# Patient Record
Sex: Male | Born: 1945
Health system: Southern US, Community
[De-identification: ages and names within clinical notes are randomized; demographics above are authoritative.]

## PROBLEM LIST (undated history)

## (undated) DIAGNOSIS — E119 Type 2 diabetes mellitus without complications: Secondary | ICD-10-CM

## (undated) DIAGNOSIS — K219 Gastro-esophageal reflux disease without esophagitis: Secondary | ICD-10-CM

## (undated) DIAGNOSIS — G009 Bacterial meningitis, unspecified: Secondary | ICD-10-CM

## (undated) DIAGNOSIS — I10 Essential (primary) hypertension: Secondary | ICD-10-CM

## (undated) DIAGNOSIS — E785 Hyperlipidemia, unspecified: Secondary | ICD-10-CM

## (undated) DIAGNOSIS — D3610 Benign neoplasm of peripheral nerves and autonomic nervous system, unspecified: Secondary | ICD-10-CM

## (undated) DIAGNOSIS — E11621 Type 2 diabetes mellitus with foot ulcer: Secondary | ICD-10-CM

## (undated) DIAGNOSIS — L97509 Non-pressure chronic ulcer of other part of unspecified foot with unspecified severity: Secondary | ICD-10-CM

## (undated) DIAGNOSIS — M109 Gout, unspecified: Secondary | ICD-10-CM

## (undated) DIAGNOSIS — J309 Allergic rhinitis, unspecified: Secondary | ICD-10-CM

## (undated) DIAGNOSIS — E1140 Type 2 diabetes mellitus with diabetic neuropathy, unspecified: Secondary | ICD-10-CM

## (undated) HISTORY — DX: Allergic rhinitis, unspecified: J30.9

## (undated) HISTORY — DX: Hyperlipidemia, unspecified: E78.5

## (undated) HISTORY — PX: BRAIN SURGERY: SHX531

## (undated) HISTORY — DX: Essential (primary) hypertension: I10

---

## 2004-08-01 ENCOUNTER — Ambulatory Visit: Payer: Self-pay | Admitting: Unknown Physician Specialty

## 2006-04-09 HISTORY — PX: COLON SURGERY: SHX602

## 2006-05-07 ENCOUNTER — Ambulatory Visit: Payer: Self-pay | Admitting: Gastroenterology

## 2006-06-03 ENCOUNTER — Ambulatory Visit: Payer: Self-pay | Admitting: Surgery

## 2006-06-11 ENCOUNTER — Inpatient Hospital Stay: Payer: Self-pay | Admitting: Surgery

## 2006-09-14 ENCOUNTER — Inpatient Hospital Stay: Payer: Self-pay | Admitting: Rheumatology

## 2006-09-14 ENCOUNTER — Other Ambulatory Visit: Payer: Self-pay

## 2007-08-26 ENCOUNTER — Ambulatory Visit: Payer: Self-pay | Admitting: Gastroenterology

## 2009-02-10 ENCOUNTER — Ambulatory Visit: Payer: Self-pay | Admitting: Unknown Physician Specialty

## 2010-03-21 ENCOUNTER — Ambulatory Visit: Payer: Self-pay | Admitting: Gastroenterology

## 2010-03-24 LAB — PATHOLOGY REPORT

## 2012-05-27 ENCOUNTER — Ambulatory Visit: Payer: Self-pay | Admitting: Gastroenterology

## 2012-05-29 LAB — PATHOLOGY REPORT

## 2013-03-17 ENCOUNTER — Ambulatory Visit: Payer: Self-pay | Admitting: General Practice

## 2015-03-23 DIAGNOSIS — N183 Chronic kidney disease, stage 3 unspecified: Secondary | ICD-10-CM | POA: Insufficient documentation

## 2015-04-13 DIAGNOSIS — R05 Cough: Secondary | ICD-10-CM | POA: Diagnosis not present

## 2015-04-13 DIAGNOSIS — R0602 Shortness of breath: Secondary | ICD-10-CM | POA: Diagnosis not present

## 2015-04-13 DIAGNOSIS — L089 Local infection of the skin and subcutaneous tissue, unspecified: Secondary | ICD-10-CM | POA: Diagnosis not present

## 2015-04-13 DIAGNOSIS — R062 Wheezing: Secondary | ICD-10-CM | POA: Diagnosis not present

## 2015-04-21 DIAGNOSIS — R0602 Shortness of breath: Secondary | ICD-10-CM | POA: Diagnosis not present

## 2015-04-21 DIAGNOSIS — L089 Local infection of the skin and subcutaneous tissue, unspecified: Secondary | ICD-10-CM | POA: Diagnosis not present

## 2015-04-21 DIAGNOSIS — L97521 Non-pressure chronic ulcer of other part of left foot limited to breakdown of skin: Secondary | ICD-10-CM | POA: Diagnosis not present

## 2015-04-21 DIAGNOSIS — I10 Essential (primary) hypertension: Secondary | ICD-10-CM | POA: Diagnosis not present

## 2015-04-21 DIAGNOSIS — R05 Cough: Secondary | ICD-10-CM | POA: Diagnosis not present

## 2015-04-21 DIAGNOSIS — L03032 Cellulitis of left toe: Secondary | ICD-10-CM | POA: Diagnosis not present

## 2015-04-28 DIAGNOSIS — L97521 Non-pressure chronic ulcer of other part of left foot limited to breakdown of skin: Secondary | ICD-10-CM | POA: Diagnosis not present

## 2015-05-05 DIAGNOSIS — M2042 Other hammer toe(s) (acquired), left foot: Secondary | ICD-10-CM | POA: Diagnosis not present

## 2015-05-05 DIAGNOSIS — L97521 Non-pressure chronic ulcer of other part of left foot limited to breakdown of skin: Secondary | ICD-10-CM | POA: Diagnosis not present

## 2015-05-19 DIAGNOSIS — L97521 Non-pressure chronic ulcer of other part of left foot limited to breakdown of skin: Secondary | ICD-10-CM | POA: Diagnosis not present

## 2015-06-14 DIAGNOSIS — L97521 Non-pressure chronic ulcer of other part of left foot limited to breakdown of skin: Secondary | ICD-10-CM | POA: Diagnosis not present

## 2015-06-30 DIAGNOSIS — R05 Cough: Secondary | ICD-10-CM | POA: Diagnosis not present

## 2015-06-30 DIAGNOSIS — I10 Essential (primary) hypertension: Secondary | ICD-10-CM | POA: Diagnosis not present

## 2015-07-01 DIAGNOSIS — R05 Cough: Secondary | ICD-10-CM | POA: Diagnosis not present

## 2015-07-12 DIAGNOSIS — L97521 Non-pressure chronic ulcer of other part of left foot limited to breakdown of skin: Secondary | ICD-10-CM | POA: Diagnosis not present

## 2015-07-12 DIAGNOSIS — E1142 Type 2 diabetes mellitus with diabetic polyneuropathy: Secondary | ICD-10-CM | POA: Diagnosis not present

## 2015-07-12 DIAGNOSIS — B351 Tinea unguium: Secondary | ICD-10-CM | POA: Diagnosis not present

## 2015-07-18 DIAGNOSIS — E6609 Other obesity due to excess calories: Secondary | ICD-10-CM | POA: Diagnosis not present

## 2015-07-18 DIAGNOSIS — R05 Cough: Secondary | ICD-10-CM | POA: Diagnosis not present

## 2015-07-18 DIAGNOSIS — J449 Chronic obstructive pulmonary disease, unspecified: Secondary | ICD-10-CM | POA: Diagnosis not present

## 2015-07-18 DIAGNOSIS — J309 Allergic rhinitis, unspecified: Secondary | ICD-10-CM | POA: Diagnosis not present

## 2015-07-19 DIAGNOSIS — Z86010 Personal history of colonic polyps: Secondary | ICD-10-CM | POA: Diagnosis not present

## 2015-07-27 ENCOUNTER — Other Ambulatory Visit: Payer: Self-pay | Admitting: Specialist

## 2015-07-27 DIAGNOSIS — R05 Cough: Secondary | ICD-10-CM

## 2015-07-27 DIAGNOSIS — R059 Cough, unspecified: Secondary | ICD-10-CM

## 2015-07-27 DIAGNOSIS — I119 Hypertensive heart disease without heart failure: Secondary | ICD-10-CM | POA: Diagnosis not present

## 2015-07-27 DIAGNOSIS — R0602 Shortness of breath: Secondary | ICD-10-CM | POA: Diagnosis not present

## 2015-07-27 DIAGNOSIS — E6609 Other obesity due to excess calories: Secondary | ICD-10-CM | POA: Diagnosis not present

## 2015-07-27 DIAGNOSIS — J309 Allergic rhinitis, unspecified: Secondary | ICD-10-CM | POA: Diagnosis not present

## 2015-08-02 DIAGNOSIS — I119 Hypertensive heart disease without heart failure: Secondary | ICD-10-CM | POA: Diagnosis not present

## 2015-08-02 DIAGNOSIS — R0602 Shortness of breath: Secondary | ICD-10-CM | POA: Diagnosis not present

## 2015-08-03 ENCOUNTER — Ambulatory Visit
Admission: RE | Admit: 2015-08-03 | Discharge: 2015-08-03 | Disposition: A | Discharge status: Home / Self Care | Payer: PPO | Source: Ambulatory Visit | Attending: Specialist | Admitting: Specialist

## 2015-08-03 DIAGNOSIS — R05 Cough: Secondary | ICD-10-CM | POA: Diagnosis not present

## 2015-08-03 DIAGNOSIS — R918 Other nonspecific abnormal finding of lung field: Secondary | ICD-10-CM | POA: Insufficient documentation

## 2015-08-03 DIAGNOSIS — R0602 Shortness of breath: Secondary | ICD-10-CM | POA: Insufficient documentation

## 2015-08-03 DIAGNOSIS — R948 Abnormal results of function studies of other organs and systems: Secondary | ICD-10-CM | POA: Insufficient documentation

## 2015-08-03 DIAGNOSIS — R059 Cough, unspecified: Secondary | ICD-10-CM

## 2015-08-09 ENCOUNTER — Other Ambulatory Visit: Payer: Self-pay | Admitting: Specialist

## 2015-08-09 DIAGNOSIS — R05 Cough: Secondary | ICD-10-CM | POA: Diagnosis not present

## 2015-08-09 DIAGNOSIS — R918 Other nonspecific abnormal finding of lung field: Secondary | ICD-10-CM

## 2015-08-09 DIAGNOSIS — J309 Allergic rhinitis, unspecified: Secondary | ICD-10-CM | POA: Diagnosis not present

## 2015-08-30 ENCOUNTER — Ambulatory Visit: Admission: RE | Admit: 2015-08-30 | Payer: PPO | Source: Ambulatory Visit | Admitting: Gastroenterology

## 2015-08-30 ENCOUNTER — Encounter: Admission: RE | Payer: Self-pay | Source: Ambulatory Visit

## 2015-08-30 SURGERY — COLONOSCOPY WITH PROPOFOL
Anesthesia: General

## 2015-09-15 DIAGNOSIS — I10 Essential (primary) hypertension: Secondary | ICD-10-CM | POA: Diagnosis not present

## 2015-09-15 DIAGNOSIS — E782 Mixed hyperlipidemia: Secondary | ICD-10-CM | POA: Diagnosis not present

## 2015-09-15 DIAGNOSIS — N183 Chronic kidney disease, stage 3 (moderate): Secondary | ICD-10-CM | POA: Diagnosis not present

## 2015-09-15 DIAGNOSIS — E119 Type 2 diabetes mellitus without complications: Secondary | ICD-10-CM | POA: Diagnosis not present

## 2015-09-20 ENCOUNTER — Ambulatory Visit
Admission: RE | Admit: 2015-09-20 | Discharge: 2015-09-20 | Disposition: A | Discharge status: Home / Self Care | Payer: PPO | Source: Ambulatory Visit | Attending: Specialist | Admitting: Specialist

## 2015-09-20 DIAGNOSIS — R918 Other nonspecific abnormal finding of lung field: Secondary | ICD-10-CM | POA: Insufficient documentation

## 2015-09-20 DIAGNOSIS — J984 Other disorders of lung: Secondary | ICD-10-CM | POA: Diagnosis not present

## 2015-09-22 DIAGNOSIS — R938 Abnormal findings on diagnostic imaging of other specified body structures: Secondary | ICD-10-CM | POA: Diagnosis not present

## 2015-09-22 DIAGNOSIS — E6609 Other obesity due to excess calories: Secondary | ICD-10-CM | POA: Diagnosis not present

## 2015-09-22 DIAGNOSIS — Z8739 Personal history of other diseases of the musculoskeletal system and connective tissue: Secondary | ICD-10-CM | POA: Diagnosis not present

## 2015-09-22 DIAGNOSIS — J42 Unspecified chronic bronchitis: Secondary | ICD-10-CM | POA: Diagnosis not present

## 2015-09-22 DIAGNOSIS — E119 Type 2 diabetes mellitus without complications: Secondary | ICD-10-CM | POA: Diagnosis not present

## 2015-09-22 DIAGNOSIS — E782 Mixed hyperlipidemia: Secondary | ICD-10-CM | POA: Diagnosis not present

## 2015-09-22 DIAGNOSIS — N183 Chronic kidney disease, stage 3 (moderate): Secondary | ICD-10-CM | POA: Diagnosis not present

## 2015-09-22 DIAGNOSIS — R05 Cough: Secondary | ICD-10-CM | POA: Diagnosis not present

## 2015-09-28 ENCOUNTER — Encounter: Payer: Self-pay | Admitting: Pulmonary Disease

## 2015-09-28 ENCOUNTER — Encounter (INDEPENDENT_AMBULATORY_CARE_PROVIDER_SITE_OTHER): Payer: Self-pay

## 2015-09-28 ENCOUNTER — Ambulatory Visit (INDEPENDENT_AMBULATORY_CARE_PROVIDER_SITE_OTHER): Payer: PPO | Admitting: Pulmonary Disease

## 2015-09-28 VITALS — BP 130/78 | HR 50 | Ht 72.0 in | Wt 281.0 lb

## 2015-09-28 DIAGNOSIS — R05 Cough: Secondary | ICD-10-CM

## 2015-09-28 DIAGNOSIS — R059 Cough, unspecified: Secondary | ICD-10-CM

## 2015-09-28 NOTE — Patient Instructions (Signed)
Bronchoscopy planned for Tues 6/27 @ 1 PM - Misty will call you with further instructions. You may have a light breakfast on the day of procedure

## 2015-09-29 ENCOUNTER — Encounter: Payer: Self-pay | Admitting: Pulmonary Disease

## 2015-09-29 NOTE — Progress Notes (Signed)
PULMONARY CONSULT NOTE  Requesting MD/Service: Raul Del Date of initial consultation: 09/28/15 Reason for consultation: Bronchoscopy  PT PROFILE: 70 y.o. M referred by Dr Raul Del to consider bronchoscopy for chronic cough and abnormal CXR, CT chest  HPI:  As above. He has had severe cough since 02/2015. He has been tried on multiple empiric therapies without relief. His evaluation has also included CT chest X 2 reported below. Presently denies CP, fever, purulent sputum, hemoptysis, LE edema and calf tenderness   Past Medical History  Diagnosis Date  . Hypertension   . Allergic rhinitis   . Hyperlipidemia   . Diabetes [facility])     Past Surgical History  Procedure Laterality Date  . Colon surgery      MEDICATIONS: I have reviewed all medications and confirmed regimen as documented  Social History   Social History  . Marital Status: Married    Spouse Name: N/A  . Number of Children: N/A  . Years of Education: N/A   Occupational History  . Not on file.   Social History Main Topics  . Smoking status: Never Smoker   . Smokeless tobacco: Never Used  . Alcohol Use: 4.8 oz/week    4 Shots of liquor, 4 Cans of beer per week  . Drug Use: No  . Sexual Activity: Not on file   Other Topics Concern  . Not on file   Social History Narrative    Family History  Problem Relation Age of Onset  . Family history unknown: Yes    ROS: No fever, myalgias/arthralgias, unexplained weight loss or weight gain No new focal weakness or sensory deficits No otalgia, hearing loss, visual changes, nasal and sinus symptoms, mouth and throat problems No neck pain or adenopathy No abdominal pain, N/V/D, diarrhea, change in bowel pattern No dysuria, change in urinary pattern   Filed Vitals:   09/28/15 1025  BP: 130/78  Pulse: 50  Height: 6' (1.829 m)  Weight: 281 lb (127.461 kg)  SpO2: 95%     EXAM:  Gen: Obese, No overt respiratory distress HEENT: NCAT, sclera white, oropharynx  normal Neck: Supple without LAN, thyromegaly, JVD Lungs: breath sounds: moderately diminished, percussion: normal, No adventitious sounds Cardiovascular: RRR, no murmurs noted Abdomen: Soft, nontender, normal BS Ext: without clubbing, cyanosis, edema Neuro: CNs grossly intact, motor and sensory intact Skin: Limited exam, no lesions noted  DATA:   No flowsheet data found.  No flowsheet data found.  CT chest (09/20/15):  IMPRESSION: 1. Partial clearing of airspace densities within the right middle lobe and right lower lobe compatible with an inflammatory or infectious process. 2. Filling defects within the right lower lobe airway identified which may reflect aspirated debris. Underlying endobronchial lesion cannot be excluded. Consider further evaluation with bronchoscopy.  IMPRESSION:     ICD-9-CM ICD-10-CM   1. Cough 786.2 R05    With CT findings and prior history of brain surgery, concern for chronic aspiration  PLAN:  Bronchoscopy for airway exam planned for 10/04/15 @ 1:00 PM I discussed thee procedure, its indications, risks and alternatives in detail and answered all questions Follow up will be with Dr Raul Del. I will send him a copy of the bronchoscopy report   Merton Border, MD PCCM service Mobile [phone] Pager [phone] 09/29/2015

## 2015-10-04 ENCOUNTER — Encounter: Payer: Self-pay | Admitting: *Deleted

## 2015-10-04 ENCOUNTER — Encounter
Admission: RE | Disposition: A | Discharge status: Home / Self Care | Payer: Self-pay | Source: Ambulatory Visit | Attending: Pulmonary Disease

## 2015-10-04 ENCOUNTER — Ambulatory Visit
Admission: RE | Admit: 2015-10-04 | Discharge: 2015-10-04 | Disposition: A | Discharge status: Home / Self Care | Payer: PPO | Source: Ambulatory Visit | Attending: Pulmonary Disease | Admitting: Pulmonary Disease

## 2015-10-04 DIAGNOSIS — J8410 Pulmonary fibrosis, unspecified: Secondary | ICD-10-CM | POA: Insufficient documentation

## 2015-10-04 DIAGNOSIS — E785 Hyperlipidemia, unspecified: Secondary | ICD-10-CM | POA: Insufficient documentation

## 2015-10-04 DIAGNOSIS — R05 Cough: Secondary | ICD-10-CM | POA: Insufficient documentation

## 2015-10-04 DIAGNOSIS — E119 Type 2 diabetes mellitus without complications: Secondary | ICD-10-CM | POA: Diagnosis not present

## 2015-10-04 DIAGNOSIS — I10 Essential (primary) hypertension: Secondary | ICD-10-CM | POA: Diagnosis not present

## 2015-10-04 HISTORY — DX: Gout, unspecified: M10.9

## 2015-10-04 HISTORY — PX: FLEXIBLE BRONCHOSCOPY: SHX5094

## 2015-10-04 HISTORY — DX: Bacterial meningitis, unspecified: G00.9

## 2015-10-04 SURGERY — BRONCHOSCOPY, FLEXIBLE
Anesthesia: Moderate Sedation

## 2015-10-04 MED ORDER — HYDROCOD POLST-CPM POLST ER 10-8 MG/5ML PO SUER
5.0000 mL | Freq: Once | ORAL | Status: AC
  Administered 2015-10-04: 5 mL via ORAL

## 2015-10-04 MED ORDER — FENTANYL CITRATE (PF) 100 MCG/2ML IJ SOLN
INTRAMUSCULAR | Status: DC
Start: 2015-10-04 — End: 2015-10-04
  Filled 2015-10-04: qty 2

## 2015-10-04 MED ORDER — MIDAZOLAM HCL 5 MG/5ML IJ SOLN
INTRAMUSCULAR | Status: DC
Start: 2015-10-04 — End: 2015-10-04
  Filled 2015-10-04: qty 10

## 2015-10-04 MED ORDER — PHENYLEPHRINE HCL 0.25 % NA SOLN: 1.0000 | Freq: Four times a day (QID) | NASAL | Status: DC | PRN

## 2015-10-04 MED ORDER — BUTAMBEN-TETRACAINE-BENZOCAINE 2-2-14 % EX AERO
INHALATION_SPRAY | CUTANEOUS | Status: AC
  Filled 2015-10-04: qty 20

## 2015-10-04 MED ORDER — HYDROCOD POLST-CPM POLST ER 10-8 MG/5ML PO SUER
ORAL | Status: AC
  Filled 2015-10-04: qty 5

## 2015-10-04 MED ORDER — MIDAZOLAM HCL 5 MG/5ML IJ SOLN
INTRAMUSCULAR | Status: AC
  Filled 2015-10-04: qty 10

## 2015-10-04 MED ORDER — FENTANYL CITRATE (PF) 100 MCG/2ML IJ SOLN
INTRAMUSCULAR | Status: DC
Start: 2015-10-04 — End: 2015-10-04
  Filled 2015-10-04: qty 4

## 2015-10-04 MED ORDER — LIDOCAINE HCL 2 % EX GEL: 1.0000 "application " | Freq: Once | CUTANEOUS | Status: DC

## 2015-10-04 MED ORDER — FENTANYL CITRATE (PF) 100 MCG/2ML IJ SOLN
INTRAMUSCULAR | Status: AC | PRN
  Administered 2015-10-04 (×2): 25 ug via INTRAVENOUS
  Administered 2015-10-04: 50 ug via INTRAVENOUS

## 2015-10-04 MED ORDER — MIDAZOLAM HCL 2 MG/2ML IJ SOLN
INTRAMUSCULAR | Status: AC | PRN
  Administered 2015-10-04: 2 mg via INTRAVENOUS
  Administered 2015-10-04: 1 mg via INTRAVENOUS
  Administered 2015-10-04: 2 mg via INTRAVENOUS

## 2015-10-04 MED ORDER — BUTAMBEN-TETRACAINE-BENZOCAINE 2-2-14 % EX AERO: 1.0000 | INHALATION_SPRAY | Freq: Once | CUTANEOUS | Status: DC

## 2015-10-04 MED ORDER — FENTANYL CITRATE (PF) 100 MCG/2ML IJ SOLN
INTRAMUSCULAR | Status: AC
  Filled 2015-10-04: qty 2

## 2015-10-04 NOTE — Discharge Instructions (Signed)
Flexible Bronchoscopy, Care After °These instructions give you information on caring for yourself after your procedure. Your doctor may also give you more specific instructions. Call your doctor if you have any problems or questions after your procedure. °HOME CARE °· Do not eat or drink anything for 2 hours after your procedure. If you try to eat or drink before the medicine wears off, food or drink could go into your lungs. You could also burn yourself. °· After 2 hours have passed and when you can cough and gag normally, you may eat soft food and drink liquids slowly. °· The day after the test, you may eat your normal diet. °· You may do your normal activities. °· Keep all doctor visits. °GET HELP RIGHT AWAY IF: °· You get more and more short of breath. °· You get light-headed. °· You feel like you are going to pass out (faint). °· You have chest pain. °· You have new problems that worry you. °· You cough up more than a little blood. °· You cough up more blood than before. °MAKE SURE YOU: °· Understand these instructions. °· Will watch your condition. °· Will get help right away if you are not doing well or get worse. °  °This information is not intended to replace advice given to you by your health care provider. Make sure you discuss any questions you have with your health care provider. °  °Document Released: 01/21/2009 Document Revised: 03/31/2013 Document Reviewed: 11/28/2012 °Elsevier Interactive Patient Education ©2016 Elsevier Inc. ° °

## 2015-10-04 NOTE — Progress Notes (Signed)
Pt back to specials for recovery post bronchoscopy,. Vss, wife present, Dr simonds out to speak with pt/wife.

## 2015-10-04 NOTE — H&P (Signed)
PULMONARY CONSULT NOTE  Requesting MD/Service: Raul Del Date of initial consultation: 09/28/15 Reason for consultation: Bronchoscopy  PT PROFILE: 70 y.o. M referred by Dr Raul Del to consider bronchoscopy for chronic cough and abnormal CXR, CT chest  HPI:  As above. He has had severe cough since 02/2015. He has been tried on multiple empiric therapies without relief. His evaluation has also included CT chest X 2 reported below. Presently denies CP, fever, purulent sputum, hemoptysis, LE edema and calf tenderness   Past Medical History  Diagnosis Date  . Hypertension   . Allergic rhinitis   . Hyperlipidemia   . Diabetes [facility])     Past Surgical History  Procedure Laterality Date  . Colon surgery      MEDICATIONS: I have reviewed all medications and confirmed regimen as documented  Social History   Social History  . Marital Status: Married    Spouse Name: N/A  . Number of Children: N/A  . Years of Education: N/A   Occupational History  . Not on file.   Social History Main Topics  . Smoking status: Never Smoker   . Smokeless tobacco: Never Used  . Alcohol Use: 4.8 oz/week    4 Shots of liquor, 4 Cans of beer per week  . Drug Use: No  . Sexual Activity: Not on file   Other Topics Concern  . Not on file   Social History Narrative    Family History  Problem Relation Age of Onset  . Family history unknown: Yes    ROS: No fever, myalgias/arthralgias, unexplained weight loss or weight gain No new focal weakness or sensory deficits No otalgia, hearing loss, visual changes, nasal and sinus symptoms, mouth and throat problems No neck pain or adenopathy No abdominal pain, N/V/D, diarrhea, change in bowel pattern No dysuria, change in urinary pattern   Filed Vitals:   09/28/15 1025  BP: 130/78  Pulse: 50  Height: 6' (1.829 m)  Weight: 281 lb (127.461 kg)   SpO2: 95%     EXAM:  Gen: Obese, No overt respiratory distress HEENT: NCAT, sclera white, oropharynx normal Neck: Supple without LAN, thyromegaly, JVD Lungs: breath sounds: moderately diminished, percussion: normal, No adventitious sounds Cardiovascular: RRR, no murmurs noted Abdomen: Soft, nontender, normal BS Ext: without clubbing, cyanosis, edema Neuro: CNs grossly intact, motor and sensory intact Skin: Limited exam, no lesions noted  DATA:   No flowsheet data found.  No flowsheet data found.  CT chest (09/20/15): IMPRESSION: 1. Partial clearing of airspace densities within the right middle lobe and right lower lobe compatible with an inflammatory or infectious process. 2. Filling defects within the right lower lobe airway identified which may reflect aspirated debris. Underlying endobronchial lesion cannot be excluded. Consider further evaluation with bronchoscopy.  IMPRESSION:    ICD-9-CM ICD-10-CM   1. Cough 786.2 R05    With CT findings and prior history of brain surgery, concern for chronic aspiration  PLAN:  Bronchoscopy for airway exam planned for 10/04/15 @ 1:00 PM I discussed thee procedure, its indications, risks and alternatives in detail and answered all questions Follow up will be with Dr Raul Del. I will send him a copy of the bronchoscopy report   Merton Border, MD PCCM service Mobile [phone] Pager [phone] 10/04/2015

## 2015-10-06 LAB — SURGICAL PATHOLOGY

## 2015-10-07 ENCOUNTER — Ambulatory Visit (INDEPENDENT_AMBULATORY_CARE_PROVIDER_SITE_OTHER): Payer: PPO | Admitting: Cardiothoracic Surgery

## 2015-10-07 ENCOUNTER — Encounter: Payer: Self-pay | Admitting: Cardiothoracic Surgery

## 2015-10-07 ENCOUNTER — Telehealth: Payer: Self-pay | Admitting: Cardiothoracic Surgery

## 2015-10-07 VITALS — BP 136/70 | HR 56 | Temp 98.5°F | Ht 72.0 in | Wt 277.8 lb

## 2015-10-07 DIAGNOSIS — R918 Other nonspecific abnormal finding of lung field: Secondary | ICD-10-CM

## 2015-10-07 DIAGNOSIS — N529 Male erectile dysfunction, unspecified: Secondary | ICD-10-CM | POA: Insufficient documentation

## 2015-10-07 DIAGNOSIS — J309 Allergic rhinitis, unspecified: Secondary | ICD-10-CM | POA: Insufficient documentation

## 2015-10-07 DIAGNOSIS — D3610 Benign neoplasm of peripheral nerves and autonomic nervous system, unspecified: Secondary | ICD-10-CM | POA: Insufficient documentation

## 2015-10-07 DIAGNOSIS — R05 Cough: Secondary | ICD-10-CM | POA: Diagnosis not present

## 2015-10-07 DIAGNOSIS — Z8739 Personal history of other diseases of the musculoskeletal system and connective tissue: Secondary | ICD-10-CM | POA: Insufficient documentation

## 2015-10-07 DIAGNOSIS — E782 Mixed hyperlipidemia: Secondary | ICD-10-CM | POA: Insufficient documentation

## 2015-10-07 DIAGNOSIS — R222 Localized swelling, mass and lump, trunk: Secondary | ICD-10-CM | POA: Diagnosis not present

## 2015-10-07 DIAGNOSIS — E119 Type 2 diabetes mellitus without complications: Secondary | ICD-10-CM | POA: Insufficient documentation

## 2015-10-07 DIAGNOSIS — E6609 Other obesity due to excess calories: Secondary | ICD-10-CM | POA: Diagnosis not present

## 2015-10-07 DIAGNOSIS — Z86010 Personal history of colonic polyps: Secondary | ICD-10-CM | POA: Insufficient documentation

## 2015-10-07 DIAGNOSIS — E669 Obesity, unspecified: Secondary | ICD-10-CM | POA: Insufficient documentation

## 2015-10-07 NOTE — Telephone Encounter (Signed)
I have faxed all clinic notes, labs, pathology and imaging reports to [facility] at [facility] @ [phone]. I have also called registration and informed them of all the demographic information for the patient so that Carolann can call and make an appointment for the patient. Carolann stated that she has attempted to contact the patient, however no answer and she left a message on voicemail and will try to contact the patient again today.

## 2015-10-07 NOTE — Progress Notes (Signed)
Patient ID: Taylor Ross, male   DOB: 04-29-1945, 70 y.o.   MRN: [id number]  Chief Complaint  Patient presents with  . New Patient (Initial Visit)    Lung Mass (From Dr. Raul Del)    Referred By Dr. Raul Del Reason for Referral bronchus intermedius mass  HPI Location, Quality, Duration, Severity, Timing, Context, Modifying Factors, Associated Signs and Symptoms.  Taylor Ross is a 70 y.o. male.  This patient is a 70 year old white male with a 6 month history of cough. He describes his cough is intermittent and nonproductive. When it occurs he gets significant shortness of breath and chest pain. When they cough is not occurring he remains short of breath at baseline which she attributes to deconditioning. He is a lifelong nonsmoker. He saw Dr. Raul Del in April where a CT scan was performed. This revealed some mucus impaction in the right mainstem and bronchus intermedius. He was symptomatically treated and then a repeat CT scan was performed. 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 The patient then underwent bronchoscopy which revealed a near obstructing fungating mass within the bronchus intermedius that on biopsy was consistent with granulomatous inflammation. There is no evidence of malignancy. The patient then followed up with Dr. Raul Del who contacted me for possible surgical resection. As mentioned the patient is a lifelong nonsmoker. He has no prior history of asbestos exposure. There is no family history of malignancy.   Past Medical History  Diagnosis Date  . Hypertension   . Allergic rhinitis   . Hyperlipidemia   . Gout   . Bacterial meningitis     Past Surgical History  Procedure Laterality Date  . Flexible bronchoscopy N/A 10/04/2015    Procedure: FLEXIBLE BRONCHOSCOPY;  Surgeon: Wilhelmina Mcardle, MD;  Location: [facility] ORS;  Service: Pulmonary;  Laterality: N/A;  . Colon surgery  2008    Dr. Tamala Julian [facility])  . Brain surgery  2006 x 3;  2010 x 1    Benign Tumor Removal ([place])    Family History  Problem Relation Age of Onset  . Family history unknown: Yes    Social History Social History  Substance Use Topics  . Smoking status: Never Smoker   . Smokeless tobacco: Never Used  . Alcohol Use: 7.2 oz/week    12 Cans of beer per week    Allergies  Allergen Reactions  . Amoxicillin-Pot Clavulanate Rash  . Prednisone Rash    Current Outpatient Prescriptions  Medication Sig Dispense Refill  . albuterol (VENTOLIN HFA) 108 (90 Base) MCG/ACT inhaler Inhale 2 puffs into the lungs every 4 (four) hours as needed.     Marland Kitchen allopurinol (ZYLOPRIM) 300 MG tablet 300 mg daily.     Marland Kitchen aspirin EC 81 MG tablet Take 81 mg by mouth daily.     Marland Kitchen atorvastatin (LIPITOR) 10 MG tablet 10 mg daily.     . fenofibrate (TRICOR) 145 MG tablet Take 145 mg by mouth daily.     Marland Kitchen losartan (COZAAR) 25 MG tablet Take 25 mg by mouth daily.     . metoprolol succinate (TOPROL-XL) 100 MG 24 hr tablet Take 100 mg by mouth daily.     . traMADol (ULTRAM) 50 MG tablet Take 50 mg by mouth every 6 (six) hours as needed.      No current facility-administered medications for this visit.      Review of Systems A complete review of systems was asked and was negative except for the following  positive findings cough, shortness of breath, wheezing.  Blood pressure 136/70, pulse 56, temperature 98.5 F (36.9 C), temperature source Oral, height 6' (1.829 m), weight 277 lb 12.8 oz (126.009 kg).  Physical Exam CONSTITUTIONAL:  Pleasant, well-developed, well-nourished, and in no acute distress. EYES: Pupils equal and reactive to light, Sclera non-icteric EARS, NOSE, MOUTH AND THROAT:  The oropharynx was clear.  Dentition is good repair.  Oral mucosa pink and moist. LYMPH NODES:  Lymph nodes in the neck and axillae were normal RESPIRATORY:  Lungs were clear on the left but there were some end expiratory wheezes on the right with some rhonchi in the  posterior right upper chest..  Normal respiratory effort without pathologic use of accessory muscles of respiration CARDIOVASCULAR: Heart was regular without murmurs.  There were no carotid bruits. GI: The abdomen was soft, nontender, and nondistended. There were no palpable masses. There was no hepatosplenomegaly. There were normal bowel sounds in all quadrants. GU:  Rectal deferred.   MUSCULOSKELETAL:  Normal muscle strength and tone.  No clubbing or cyanosis.   SKIN:  There were no pathologic skin lesions.  There were no nodules on palpation. NEUROLOGIC:  Sensation is normal.  Cranial nerves are grossly intact. PSYCH:  Oriented to person, place and time.  Mood and affect are normal.  Data Reviewed CT scans  I have personally reviewed the patient's imaging, laboratory findings and medical records.    Assessment    I have independently reviewed the patient's CT scans. There is a lesion present within the bronchus intermedius of the right lower lobe. The lesion appears smooth-walled and consistent with an endobronchial tumor. However the tumor was biopsied and was not a carcinoid but rather granulomatous inflammation.    Plan    I discussed his care today with Dr. Raul Del and with Dr. Blythe [facility]. Dr. Girard Cooter as reviewed the case by phone and believes that he may be a candidate for endoscopic resection with laser. I discussed this with the patient and he is agreeable to seeing Dr. Girard Cooter in consultation. We will go ahead and arrange for that. He will contact me if he does not hear from Dr. Girard Cooter within the next few days.       Nestor Lewandowsky, MD 10/07/2015, 12:41 PM

## 2015-10-07 NOTE — Patient Instructions (Signed)
We have been in touch with Dr. Noel Journey at [facility]. If you have not been notified by [facility] of an appointment by Monday afternoon, please call and let me know.  You will need to stop at the Radiology Desk in the medical mall to get a disc with your images on it to take with you to your appointment.  Directions to Medical Mall: When leaving our office, go right. Go all of the way down to the very end of the hallway. You will have a purple wall in front of you. You will now have a tunnel to the hospital on your left hand side. Go through this tunnel and the elevators will be on your left. Go down to the 1st floor and take a slight left. The 2nd desk on the right hand side is the Radiology desk.

## 2015-10-10 DIAGNOSIS — R05 Cough: Secondary | ICD-10-CM | POA: Diagnosis not present

## 2015-10-10 DIAGNOSIS — R918 Other nonspecific abnormal finding of lung field: Secondary | ICD-10-CM | POA: Diagnosis not present

## 2015-10-10 DIAGNOSIS — Z79899 Other long term (current) drug therapy: Secondary | ICD-10-CM | POA: Diagnosis not present

## 2015-10-10 DIAGNOSIS — D381 Neoplasm of uncertain behavior of trachea, bronchus and lung: Secondary | ICD-10-CM | POA: Diagnosis not present

## 2015-10-10 DIAGNOSIS — I10 Essential (primary) hypertension: Secondary | ICD-10-CM | POA: Diagnosis not present

## 2015-10-10 DIAGNOSIS — Z7982 Long term (current) use of aspirin: Secondary | ICD-10-CM | POA: Diagnosis not present

## 2015-10-12 NOTE — Telephone Encounter (Signed)
Patient has been seen at [facility] on 10/10/15. Will review notes with Dr. Genevive Bi.

## 2015-10-13 DIAGNOSIS — Z79899 Other long term (current) drug therapy: Secondary | ICD-10-CM | POA: Diagnosis not present

## 2015-10-13 DIAGNOSIS — J984 Other disorders of lung: Secondary | ICD-10-CM | POA: Diagnosis not present

## 2015-10-13 DIAGNOSIS — T17828A Food in other parts of respiratory tract causing other injury, initial encounter: Secondary | ICD-10-CM | POA: Diagnosis not present

## 2015-10-13 DIAGNOSIS — R05 Cough: Secondary | ICD-10-CM | POA: Diagnosis not present

## 2015-10-13 DIAGNOSIS — Z7982 Long term (current) use of aspirin: Secondary | ICD-10-CM | POA: Diagnosis not present

## 2015-10-13 DIAGNOSIS — Z86010 Personal history of colonic polyps: Secondary | ICD-10-CM | POA: Diagnosis not present

## 2015-10-13 DIAGNOSIS — I252 Old myocardial infarction: Secondary | ICD-10-CM | POA: Diagnosis not present

## 2015-10-13 DIAGNOSIS — I10 Essential (primary) hypertension: Secondary | ICD-10-CM | POA: Diagnosis not present

## 2015-10-13 DIAGNOSIS — K219 Gastro-esophageal reflux disease without esophagitis: Secondary | ICD-10-CM | POA: Diagnosis not present

## 2015-10-13 DIAGNOSIS — Z881 Allergy status to other antibiotic agents status: Secondary | ICD-10-CM | POA: Diagnosis not present

## 2015-10-13 DIAGNOSIS — R942 Abnormal results of pulmonary function studies: Secondary | ICD-10-CM | POA: Diagnosis not present

## 2015-10-13 DIAGNOSIS — E785 Hyperlipidemia, unspecified: Secondary | ICD-10-CM | POA: Diagnosis not present

## 2015-10-13 DIAGNOSIS — R918 Other nonspecific abnormal finding of lung field: Secondary | ICD-10-CM | POA: Diagnosis not present

## 2015-10-13 DIAGNOSIS — Z888 Allergy status to other drugs, medicaments and biological substances status: Secondary | ICD-10-CM | POA: Diagnosis not present

## 2015-10-13 DIAGNOSIS — X58XXXA Exposure to other specified factors, initial encounter: Secondary | ICD-10-CM | POA: Diagnosis not present

## 2015-10-15 DIAGNOSIS — Z85038 Personal history of other malignant neoplasm of large intestine: Secondary | ICD-10-CM | POA: Diagnosis not present

## 2015-10-15 DIAGNOSIS — R5383 Other fatigue: Secondary | ICD-10-CM | POA: Diagnosis not present

## 2015-10-15 DIAGNOSIS — I252 Old myocardial infarction: Secondary | ICD-10-CM | POA: Diagnosis not present

## 2015-10-15 DIAGNOSIS — R001 Bradycardia, unspecified: Secondary | ICD-10-CM | POA: Diagnosis not present

## 2015-10-15 DIAGNOSIS — I4510 Unspecified right bundle-branch block: Secondary | ICD-10-CM | POA: Diagnosis not present

## 2015-10-15 DIAGNOSIS — Z85841 Personal history of malignant neoplasm of brain: Secondary | ICD-10-CM | POA: Diagnosis not present

## 2015-10-15 DIAGNOSIS — R918 Other nonspecific abnormal finding of lung field: Secondary | ICD-10-CM | POA: Diagnosis not present

## 2015-10-15 DIAGNOSIS — I444 Left anterior fascicular block: Secondary | ICD-10-CM | POA: Diagnosis not present

## 2015-10-15 DIAGNOSIS — I10 Essential (primary) hypertension: Secondary | ICD-10-CM | POA: Diagnosis not present

## 2015-10-15 DIAGNOSIS — I2510 Atherosclerotic heart disease of native coronary artery without angina pectoris: Secondary | ICD-10-CM | POA: Diagnosis not present

## 2015-10-15 DIAGNOSIS — Z79899 Other long term (current) drug therapy: Secondary | ICD-10-CM | POA: Diagnosis not present

## 2015-10-15 DIAGNOSIS — I452 Bifascicular block: Secondary | ICD-10-CM | POA: Diagnosis not present

## 2015-10-15 DIAGNOSIS — R911 Solitary pulmonary nodule: Secondary | ICD-10-CM | POA: Diagnosis not present

## 2015-10-15 DIAGNOSIS — I499 Cardiac arrhythmia, unspecified: Secondary | ICD-10-CM | POA: Diagnosis not present

## 2015-10-15 DIAGNOSIS — R05 Cough: Secondary | ICD-10-CM | POA: Diagnosis not present

## 2015-10-17 ENCOUNTER — Telehealth: Payer: Self-pay | Admitting: Cardiothoracic Surgery

## 2015-10-17 NOTE — Telephone Encounter (Signed)
If before 4:30 please call work phone [phone]   If you're calling after 4:30 please call her cell at [phone]

## 2015-10-17 NOTE — Telephone Encounter (Signed)
Taylor Ross is a patient of Dr Genevive Bi and was seen in the office for a lung mass on 6/30. He was seen at [facility] ER over the weekend because he lost all strength in his leg muscles.(they believe it may have been a reaction to an antibiotic, according to wife)  Now [name] is wanting patient to follow up with Dr Frederik Pear - whom it looks like Dr Genevive Bi has set up a consult to see - and other doctors. Patients wife would like to speak to [employer], as she Environmental manager has been the most helpful. They would prefer to follow up with Dr Genevive Bi, but would like Amber's guidance and advice moving forward. Please call and advise.

## 2015-10-17 NOTE — Telephone Encounter (Signed)
Will speak with Dr. Genevive Bi regarding this patient and then will get back to patient and patient's wife.

## 2015-10-18 NOTE — Telephone Encounter (Signed)
Error

## 2015-10-18 NOTE — Telephone Encounter (Signed)
Spoke with Dr. Genevive Bi about patient's concerns and he stated that he wanted to see him on Friday. Called patient's wife to let her know that Dr. Genevive Bi will see them on Friday. In addition, patient's wife stated that patient had been seen by Dr. Frederik Pear [facility]) after he was at the ED. Patient had also had a procedure done. I asked patient's wife to please bring [place] any records they had in hand for Dr. Genevive Bi to see what exactly was done. She agreed to bring.

## 2015-10-19 ENCOUNTER — Other Ambulatory Visit: Payer: Self-pay

## 2015-10-21 ENCOUNTER — Ambulatory Visit (INDEPENDENT_AMBULATORY_CARE_PROVIDER_SITE_OTHER): Payer: PPO | Admitting: Cardiothoracic Surgery

## 2015-10-21 ENCOUNTER — Encounter: Payer: Self-pay | Admitting: Cardiothoracic Surgery

## 2015-10-21 VITALS — BP 157/76 | HR 51 | Temp 98.1°F | Ht 72.0 in | Wt 267.0 lb

## 2015-10-21 DIAGNOSIS — R05 Cough: Secondary | ICD-10-CM

## 2015-10-21 DIAGNOSIS — R059 Cough, unspecified: Secondary | ICD-10-CM

## 2015-10-21 NOTE — Progress Notes (Signed)
Taylor Ross Inpatient Post-Op Note  Patient ID: Taylor Ross, male   DOB: [birth year], 70 y.o.   MRN: [id number]  HISTORY: This patient returns today in follow-up. He is a 70 year old gentleman who presented with a chronic cough and ultimately underwent bronchoscopy revealing an endobronchial tumor within the bronchus intermedius. The patient underwent bronchoscopy and only granulomatous inflammation was identified. He then underwent cryotherapy and extraction of vegetable matter from the right lower lobe bronchus. This was performed at [place]. He was placed on Avelox postoperatively and had a reaction to that causing some weakness. He presented to the emergency department where that was stopped and he was placed on additional antibiotics and probably prednisone as well. Those details are unclear as this occurred at [place]. However since his recent visit in the emergency department he now states that he feels considerably better. His pulmonary function is improved and his cough is significantly improved. He denied any fevers.   Filed Vitals:   10/21/15 0959  BP: 157/76  Pulse: 51  Temp: 98.1 F (36.7 C)     EXAM: Resp: Lungs are clear bilaterally With some rhonchi over the right lower lobe posteriorly.  No respiratory distress, normal effort. Heart:  Regular without murmurs Abd:  Abdomen is soft, non distended and non tender. No masses are palpable.  There is no rebound and no guarding.  Neurological: Alert and oriented to person, place, and time. Coordination normal.  Skin: Skin is warm and dry. No rash noted. No diaphoretic. No erythema. No pallor.  Psychiatric: Normal mood and affect. Normal behavior. Judgment and thought content normal.    ASSESSMENT: I have reviewed the information he was given at [place]. It does appear that this was indeed a reaction to some inhaled vegetable matter. The patient is scheduled to follow-up at [place]  with his pulmonologist there. He's going to undergo a repeat bronchoscopy and about 6 months.   PLAN:   At the present time he did not wish to make any further follow-up appointments with me or anyone else at this institution. He would like to have his follow-up for his lung problem at [facility]. Therefore no additional follow-up was made.    Nestor Lewandowsky, MD

## 2015-10-21 NOTE — Patient Instructions (Signed)
Please call with any questions or concerns.

## 2015-11-02 NOTE — Op Note (Signed)
[facility] Patient Name: Taylor Ross Procedure Date: 10/04/2015 1:22 PM MRN: [id number] Account #: [id number] Date of Birth: Oct 06, 1945 Admit Type: Outpatient Age: 70 Room: [facility] PROCEDURE RM 01 Gender: Male Note Status: Finalized Attending MD: Wilhelmina Mcardle ,  Procedure:         Bronchoscopy Indications:       Chronic cough Providers:         Zella Richer. Skippy Marhefka Referring MD:       Medicines:         Lidocaine applied to nares and subglottic space, Fentanyl                     100 mcg IV, Midazolam 5 mg IV Complications:     No immediate complications Procedure:         Pre-Anesthesia Assessment:                    - A History and Physical has been performed. Patient meds                     and allergies have been reviewed. The risks and benefits                     of the procedure and the sedation options and risks were                     discussed with the patient. All questions were answered                     and informed consent was obtained. Patient identification                     and proposed procedure were verified prior to the                     procedure by the physician in the pre-procedure area.                     Mental Status Examination: normal. Airway Examination:                     normal oropharyngeal airway. ASA Grade Assessment: I - A                     normal healthy patient. After reviewing the risks and                     benefits, the patient was deemed in satisfactory condition                     to undergo the procedure. The anesthesia plan was to use                     moderate sedation / analgesia (conscious sedation).                     Immediately prior to administration of medications, the                     patient was re-assessed for adequacy to receive sedatives.                     The heart rate, respiratory rate, oxygen saturations,  blood pressure, adequacy of pulmonary ventilation, and                     response to care were monitored throughout the procedure.                     The physical status of the patient was re-assessed after                     the procedure.                    After obtaining informed consent, the bronchoscope was                     passed under direct vision. Throughout the procedure, the                     patient's blood pressure, pulse, and oxygen saturations                     were monitored continuously. the Bronchoscope Olympus                     BF-Q180 S# [id number] was introduced through the left nostril                     and advanced to the tracheobronchial tree. The procedure                     was accomplished with moderate difficulty. Successful                     completion of the procedure was aided by increasing the                     dose of sedation medication. The patient tolerated the                     procedure fairly well. Findings:      The nasopharynx/oropharynx appears normal. The larynx appears normal.       The vocal cords appear normal. The subglottic space is normal. The       trachea is of normal caliber. The carina is sharp. The tracheobronchial       tree of the left lung was examined to at least the first subsegmental       level. Bronchial mucosa and anatomy in the left lung are normal; there       are no endobronchial lesions, and no secretions.      Right Lung Abnormalities: A medium sized partially obstructing fungating       lesion was found in the middle portion in the bronchus intermedius. Impression:        - The left lung was normal.                    - A lesion was found in the bronchus intermedius.                    - No specimens collected. Recommendation:    - Await biopsy results.                    - Discharge patient to home. Dr. Merton Border, MD Wilhelmina Mcardle,  11/02/2015 11:23:49 AM This report has been signed electronically. Number  of Addenda: 0 Note Initiated On: 10/04/2015  1:22 PM      [facility]

## 2015-11-07 ENCOUNTER — Encounter: Payer: Self-pay | Admitting: *Deleted

## 2015-11-08 ENCOUNTER — Ambulatory Visit: Payer: PPO | Admitting: Anesthesiology

## 2015-11-08 ENCOUNTER — Ambulatory Visit
Admission: RE | Admit: 2015-11-08 | Discharge: 2015-11-08 | Disposition: A | Discharge status: Home / Self Care | Payer: PPO | Source: Ambulatory Visit | Attending: Gastroenterology | Admitting: Gastroenterology

## 2015-11-08 ENCOUNTER — Encounter
Admission: RE | Disposition: A | Discharge status: Home / Self Care | Payer: Self-pay | Source: Ambulatory Visit | Attending: Gastroenterology

## 2015-11-08 ENCOUNTER — Encounter: Payer: Self-pay | Admitting: *Deleted

## 2015-11-08 DIAGNOSIS — Z8661 Personal history of infections of the central nervous system: Secondary | ICD-10-CM | POA: Diagnosis not present

## 2015-11-08 DIAGNOSIS — Z79899 Other long term (current) drug therapy: Secondary | ICD-10-CM | POA: Diagnosis not present

## 2015-11-08 DIAGNOSIS — K219 Gastro-esophageal reflux disease without esophagitis: Secondary | ICD-10-CM | POA: Diagnosis not present

## 2015-11-08 DIAGNOSIS — J309 Allergic rhinitis, unspecified: Secondary | ICD-10-CM | POA: Diagnosis not present

## 2015-11-08 DIAGNOSIS — E119 Type 2 diabetes mellitus without complications: Secondary | ICD-10-CM | POA: Diagnosis not present

## 2015-11-08 DIAGNOSIS — Z539 Procedure and treatment not carried out, unspecified reason: Secondary | ICD-10-CM | POA: Diagnosis not present

## 2015-11-08 DIAGNOSIS — E669 Obesity, unspecified: Secondary | ICD-10-CM | POA: Diagnosis not present

## 2015-11-08 DIAGNOSIS — M109 Gout, unspecified: Secondary | ICD-10-CM | POA: Insufficient documentation

## 2015-11-08 DIAGNOSIS — Z880 Allergy status to penicillin: Secondary | ICD-10-CM | POA: Insufficient documentation

## 2015-11-08 DIAGNOSIS — Z1211 Encounter for screening for malignant neoplasm of colon: Secondary | ICD-10-CM | POA: Insufficient documentation

## 2015-11-08 DIAGNOSIS — E1122 Type 2 diabetes mellitus with diabetic chronic kidney disease: Secondary | ICD-10-CM | POA: Diagnosis not present

## 2015-11-08 DIAGNOSIS — Z538 Procedure and treatment not carried out for other reasons: Secondary | ICD-10-CM | POA: Diagnosis not present

## 2015-11-08 DIAGNOSIS — Z86010 Personal history of colonic polyps: Secondary | ICD-10-CM | POA: Insufficient documentation

## 2015-11-08 DIAGNOSIS — K5790 Diverticulosis of intestine, part unspecified, without perforation or abscess without bleeding: Secondary | ICD-10-CM | POA: Diagnosis not present

## 2015-11-08 DIAGNOSIS — K5730 Diverticulosis of large intestine without perforation or abscess without bleeding: Secondary | ICD-10-CM | POA: Insufficient documentation

## 2015-11-08 DIAGNOSIS — N183 Chronic kidney disease, stage 3 (moderate): Secondary | ICD-10-CM | POA: Diagnosis not present

## 2015-11-08 DIAGNOSIS — Z6835 Body mass index (BMI) 35.0-35.9, adult: Secondary | ICD-10-CM | POA: Insufficient documentation

## 2015-11-08 DIAGNOSIS — Z7982 Long term (current) use of aspirin: Secondary | ICD-10-CM | POA: Insufficient documentation

## 2015-11-08 DIAGNOSIS — E785 Hyperlipidemia, unspecified: Secondary | ICD-10-CM | POA: Insufficient documentation

## 2015-11-08 DIAGNOSIS — I10 Essential (primary) hypertension: Secondary | ICD-10-CM | POA: Diagnosis not present

## 2015-11-08 DIAGNOSIS — Z888 Allergy status to other drugs, medicaments and biological substances status: Secondary | ICD-10-CM | POA: Insufficient documentation

## 2015-11-08 DIAGNOSIS — I129 Hypertensive chronic kidney disease with stage 1 through stage 4 chronic kidney disease, or unspecified chronic kidney disease: Secondary | ICD-10-CM | POA: Insufficient documentation

## 2015-11-08 DIAGNOSIS — K635 Polyp of colon: Secondary | ICD-10-CM | POA: Diagnosis not present

## 2015-11-08 HISTORY — DX: Benign neoplasm of peripheral nerves and autonomic nervous system, unspecified: D36.10

## 2015-11-08 HISTORY — PX: COLONOSCOPY WITH PROPOFOL: SHX5780

## 2015-11-08 HISTORY — DX: Type 2 diabetes mellitus without complications: E11.9

## 2015-11-08 HISTORY — DX: Gastro-esophageal reflux disease without esophagitis: K21.9

## 2015-11-08 SURGERY — COLONOSCOPY WITH PROPOFOL
Anesthesia: General

## 2015-11-08 MED ORDER — LIDOCAINE HCL (PF) 2 % IJ SOLN
INTRAMUSCULAR | Status: DC | PRN
  Administered 2015-11-08: 50 mg via INTRADERMAL

## 2015-11-08 MED ORDER — SODIUM CHLORIDE 0.9 % IV SOLN
INTRAVENOUS | Status: DC
  Administered 2015-11-08: 1000 mL via INTRAVENOUS

## 2015-11-08 MED ORDER — PROPOFOL 10 MG/ML IV BOLUS
INTRAVENOUS | Status: DC | PRN
  Administered 2015-11-08 (×2): 20 mg via INTRAVENOUS
  Administered 2015-11-08: 30 mg via INTRAVENOUS

## 2015-11-08 MED ORDER — SODIUM CHLORIDE 0.9 % IV SOLN: INTRAVENOUS | Status: DC

## 2015-11-08 MED ORDER — PROPOFOL 500 MG/50ML IV EMUL
INTRAVENOUS | Status: DC | PRN
  Administered 2015-11-08: 60 ug/kg/min via INTRAVENOUS

## 2015-11-08 NOTE — Anesthesia Preprocedure Evaluation (Addendum)
Anesthesia Evaluation  Patient identified by MRN, date of birth, ID band Patient awake    Reviewed: Allergy & Precautions, NPO status , Patient's Chart, lab work & pertinent test results, reviewed documented beta blocker date and time   History of Anesthesia Complications Negative for: history of anesthetic complications  Airway Mallampati: IV  TM Distance: >3 FB Neck ROM: Full    Dental no notable dental hx.    Pulmonary neg pulmonary ROS, neg sleep apnea, neg COPD,  Recent bronchoscopy, reports they removed a piece of food from his lung   breath sounds clear to auscultation- rhonchi (-) wheezing      Cardiovascular Exercise Tolerance: Good hypertension, Pt. on medications and Pt. on home beta blockers (-) CAD and (-) Past MI  Rhythm:Regular Rate:Normal - Systolic murmurs and - Diastolic murmurs    Neuro/Psych negative neurological ROS  negative psych ROS   GI/Hepatic Neg liver ROS, GERD  ,  Endo/Other  diabetes (diet controlled), Type 2  Renal/GU CRFRenal disease (stage 3 CKD, baseline Cr 1.4)     Musculoskeletal negative musculoskeletal ROS (+)   Abdominal (+) + obese,   Peds  Hematology negative hematology ROS (+)   Anesthesia Other Findings Past Medical History: No date: Allergic rhinitis No date: Bacterial meningitis No date: Diabetes mellitus without complication (HCC) No date: GERD (gastroesophageal reflux disease) No date: Gout No date: Hyperlipidemia No date: Hypertension No date: Schwannoma   Reproductive/Obstetrics                            Anesthesia Physical Anesthesia Plan  ASA: III  Anesthesia Plan: General   Post-op Pain Management:    Induction: Intravenous  Airway Management Planned: Natural Airway  Additional Equipment:   Intra-op Plan:   Post-operative Plan:   Informed Consent: I have reviewed the patients History and Physical, chart, labs and  discussed the procedure including the risks, benefits and alternatives for the proposed anesthesia with the patient or authorized representative who has indicated his/her understanding and acceptance.     Plan Discussed with: Anesthesiologist  Anesthesia Plan Comments:         Anesthesia Quick Evaluation

## 2015-11-08 NOTE — Transfer of Care (Signed)
Immediate Anesthesia Transfer of Care Note  Patient: Taylor Ross  Procedure(s) Performed: Procedure(s): COLONOSCOPY WITH PROPOFOL (N/A)  Patient Location: PACU  Anesthesia Type:General  Level of Consciousness: awake  Airway & Oxygen Therapy: Patient Spontanous Breathing  Post-op Assessment: Report given to RN and Post -op Vital signs reviewed and stable  Post vital signs: stable  Last Vitals:  Vitals:   11/08/15 0649  BP: (!) 161/95  Pulse: (!) 44  Resp: 18  Temp: 36.2 C    Last Pain:  Vitals:   11/08/15 0649  TempSrc: Tympanic         Complications: No apparent anesthesia complications

## 2015-11-08 NOTE — Addendum Note (Signed)
Addendum  created 11/08/15 [id number] by Emmie Niemann, MD   Anesthesia Intra Meds edited

## 2015-11-08 NOTE — Op Note (Signed)
[facility] Gastroenterology Patient Name: Taylor Ross Procedure Date: 11/08/2015 7:43 AM MRN: [id number] Account #: [id number] Date of Birth: 1946/01/10 Admit Type: Outpatient Age: 70 Room: [facility] ENDO ROOM 3 Gender: Male Note Status: Finalized Procedure:            Colonoscopy Indications:          Personal history of colonic polyps Providers:            Lollie Sails, MD Referring MD:         Dion Body (Referring MD) Medicines:            Monitored Anesthesia Care Complications:        No immediate complications. Procedure:            Pre-Anesthesia Assessment:                       - ASA Grade Assessment: III - A patient with severe                        systemic disease.                       After obtaining informed consent, the colonoscope was                        passed under direct vision. Throughout the procedure,                        the patient's blood pressure, pulse, and oxygen                        saturations were monitored continuously. The                        Colonoscope was introduced through the anus with the                        intention of advancing to the cecum. The scope was                        advanced to the sigmoid colon before the procedure was                        aborted. Medications were given. The colonoscopy was                        unusually difficult due to poor bowel prep with stool                        present. The patient tolerated the procedure well. The                        quality of the bowel preparation was poor. Findings:      Multiple small and large-mouthed diverticula were found in the sigmoid       colon. Impression:           - Preparation of the colon was poor.                       - Diverticulosis in the sigmoid colon.                       -  No specimens collected. Recommendation:       - Put patient on a clear liquid diet starting today.                       - reprep and  reschedule Procedure Code(s):    --- Professional ---                       [id number], 53, Colonoscopy, flexible; diagnostic, including                        collection of specimen(s) by brushing or washing, when                        performed (separate procedure) Diagnosis Code(s):    --- Professional ---                       Z86.010, Personal history of colonic polyps                       K57.30, Diverticulosis of large intestine without                        perforation or abscess without bleeding CPT copyright 2016 [employer]. All rights reserved. The codes documented in this report are preliminary and upon coder review may  be revised to meet current compliance requirements. Lollie Sails, MD 11/08/2015 7:56:21 AM This report has been signed electronically. Number of Addenda: 0 Note Initiated On: 11/08/2015 7:43 AM      [facility]

## 2015-11-08 NOTE — Anesthesia Postprocedure Evaluation (Signed)
Anesthesia Post Note  Patient: DEMPSEY MCSWEEN  Procedure(s) Performed: Procedure(s) (LRB): COLONOSCOPY WITH PROPOFOL (N/A)  Patient location during evaluation: PACU Anesthesia Type: General Level of consciousness: awake and alert and oriented Pain management: pain level controlled Vital Signs Assessment: post-procedure vital signs reviewed and stable Respiratory status: spontaneous breathing, nonlabored ventilation and respiratory function stable Cardiovascular status: blood pressure returned to baseline and stable Postop Assessment: no signs of nausea or vomiting Anesthetic complications: no    Last Vitals:  Vitals:   11/08/15 0649 11/08/15 0802  BP: (!) 161/95   Pulse: (!) 44 (!) 45  Resp: 18   Temp: 36.2 C 36.2 C    Last Pain:  Vitals:   11/08/15 0802  TempSrc: Tympanic                 Naara Kelty

## 2015-11-08 NOTE — H&P (Signed)
Outpatient short stay form Pre-procedure 11/08/2015 7:41 AM Lollie Sails MD  Primary Physician: Dr. Dion Body  Reason for visit:  Colonoscopy  History of present illness:  Patient is a 70 year old male presenting today for colonoscopy. Personal history of adenomatous colon polyps. His last colonoscopy was 05/27/2012 with removal of multiple adenomatous at that time. He did have a sigmoid resection due to a non-endoscopically removable polyp in 2008. He tolerated his prep well. He takes no aspirin products with the exception of 81 mg aspirin last taken yesterday. He takes no blood thinning agents otherwise.    Current Facility-Administered Medications:  .  0.9 %  sodium chloride infusion, , Intravenous, Continuous, Lollie Sails, MD, Last Rate: 20 mL/hr at 11/08/15 0703, 1,000 mL at 11/08/15 0703 .  0.9 %  sodium chloride infusion, , Intravenous, Continuous, Lollie Sails, MD  Prescriptions Prior to Admission  Medication Sig Dispense Refill Last Dose  . albuterol (VENTOLIN HFA) 108 (90 Base) MCG/ACT inhaler Inhale 2 puffs into the lungs every 4 (four) hours as needed.    11/07/2015 at Unknown time  . allopurinol (ZYLOPRIM) 300 MG tablet 300 mg daily.    11/07/2015 at Unknown time  . aspirin EC 81 MG tablet Take 81 mg by mouth daily.    11/07/2015 at Unknown time  . atorvastatin (LIPITOR) 10 MG tablet 10 mg daily.    11/07/2015 at Unknown time  . benzonatate (TESSALON) 200 MG capsule Take 200 mg by mouth 3 (three) times daily as needed for cough.   11/07/2015 at Unknown time  . budesonide-formoterol (SYMBICORT) 160-4.5 MCG/ACT inhaler Inhale 2 puffs into the lungs 2 (two) times daily.   11/07/2015 at Unknown time  . fenofibrate (TRICOR) 145 MG tablet Take 145 mg by mouth daily.    11/07/2015 at Unknown time  . hydrochlorothiazide (HYDRODIURIL) 25 MG tablet Take 25 mg by mouth daily.   11/07/2015 at Unknown time  . HYDROcodone-homatropine (HYCODAN) 5-1.5 MG/5ML syrup Take 5 mLs by  mouth every 6 (six) hours as needed for cough.   11/07/2015 at Unknown time  . losartan (COZAAR) 25 MG tablet Take 25 mg by mouth daily.    11/07/2015 at Unknown time  . metoprolol succinate (TOPROL-XL) 100 MG 24 hr tablet Take 100 mg by mouth daily.    11/08/2015 at [place]  . tadalafil (CIALIS) 5 MG tablet Take 5 mg by mouth daily as needed for erectile dysfunction.   Past Month at Unknown time  . traMADol (ULTRAM) 50 MG tablet Take 50 mg by mouth every 6 (six) hours as needed.    11/08/2015 at 0515     Allergies  Allergen Reactions  . Amoxicillin-Pot Clavulanate Rash  . Moxifloxacin Other (See Comments)    Muscle Weakness, Inability to stand  . Prednisone Rash     Past Medical History:  Diagnosis Date  . Allergic rhinitis   . Bacterial meningitis   . Diabetes mellitus without complication ([place])   . GERD (gastroesophageal reflux disease)   . Gout   . Hyperlipidemia   . Hypertension   . Schwannoma     Review of systems:      Physical Exam    Heart and lungs: Regular rate and rhythm without rub or gallop, lungs are bilaterally clear.    HEENT: Norm septic atraumatic eyes are anicteric    Other:     Pertinant exam for procedure: Soft nontender nondistended bowel sounds positive normoactive.    Planned proceedures: Colonoscopy and indicated procedures.  I have discussed the risks benefits and complications of procedures to include not limited to bleeding, infection, perforation and the risk of sedation and the patient wishes to proceed.    Lollie Sails, MD Gastroenterology 11/08/2015  7:41 AM

## 2015-11-09 ENCOUNTER — Ambulatory Visit: Payer: PPO | Admitting: Anesthesiology

## 2015-11-09 ENCOUNTER — Encounter
Admission: RE | Disposition: A | Discharge status: Home Health | Payer: Self-pay | Source: Ambulatory Visit | Attending: Gastroenterology

## 2015-11-09 ENCOUNTER — Ambulatory Visit
Admission: RE | Admit: 2015-11-09 | Discharge: 2015-11-09 | Disposition: A | Discharge status: Home Health | Payer: PPO | Source: Ambulatory Visit | Attending: Gastroenterology | Admitting: Gastroenterology

## 2015-11-09 ENCOUNTER — Encounter: Payer: Self-pay | Admitting: Gastroenterology

## 2015-11-09 DIAGNOSIS — D120 Benign neoplasm of cecum: Secondary | ICD-10-CM | POA: Insufficient documentation

## 2015-11-09 DIAGNOSIS — E785 Hyperlipidemia, unspecified: Secondary | ICD-10-CM | POA: Insufficient documentation

## 2015-11-09 DIAGNOSIS — K5730 Diverticulosis of large intestine without perforation or abscess without bleeding: Secondary | ICD-10-CM | POA: Insufficient documentation

## 2015-11-09 DIAGNOSIS — G4730 Sleep apnea, unspecified: Secondary | ICD-10-CM | POA: Diagnosis not present

## 2015-11-09 DIAGNOSIS — Z86010 Personal history of colonic polyps: Secondary | ICD-10-CM | POA: Diagnosis not present

## 2015-11-09 DIAGNOSIS — Z980 Intestinal bypass and anastomosis status: Secondary | ICD-10-CM | POA: Insufficient documentation

## 2015-11-09 DIAGNOSIS — I10 Essential (primary) hypertension: Secondary | ICD-10-CM | POA: Diagnosis not present

## 2015-11-09 DIAGNOSIS — M109 Gout, unspecified: Secondary | ICD-10-CM | POA: Diagnosis not present

## 2015-11-09 DIAGNOSIS — D122 Benign neoplasm of ascending colon: Secondary | ICD-10-CM | POA: Insufficient documentation

## 2015-11-09 DIAGNOSIS — K219 Gastro-esophageal reflux disease without esophagitis: Secondary | ICD-10-CM | POA: Insufficient documentation

## 2015-11-09 DIAGNOSIS — K635 Polyp of colon: Secondary | ICD-10-CM | POA: Diagnosis not present

## 2015-11-09 DIAGNOSIS — Z79899 Other long term (current) drug therapy: Secondary | ICD-10-CM | POA: Insufficient documentation

## 2015-11-09 DIAGNOSIS — J45909 Unspecified asthma, uncomplicated: Secondary | ICD-10-CM | POA: Insufficient documentation

## 2015-11-09 DIAGNOSIS — K640 First degree hemorrhoids: Secondary | ICD-10-CM | POA: Diagnosis not present

## 2015-11-09 DIAGNOSIS — K5790 Diverticulosis of intestine, part unspecified, without perforation or abscess without bleeding: Secondary | ICD-10-CM | POA: Diagnosis not present

## 2015-11-09 DIAGNOSIS — E119 Type 2 diabetes mellitus without complications: Secondary | ICD-10-CM | POA: Insufficient documentation

## 2015-11-09 DIAGNOSIS — Z880 Allergy status to penicillin: Secondary | ICD-10-CM | POA: Insufficient documentation

## 2015-11-09 HISTORY — PX: COLONOSCOPY WITH PROPOFOL: SHX5780

## 2015-11-09 SURGERY — COLONOSCOPY WITH PROPOFOL
Anesthesia: General

## 2015-11-09 MED ORDER — LIDOCAINE 2% (20 MG/ML) 5 ML SYRINGE
INTRAMUSCULAR | Status: DC | PRN
  Administered 2015-11-09: 40 mg via INTRAVENOUS

## 2015-11-09 MED ORDER — EPHEDRINE SULFATE 50 MG/ML IJ SOLN
INTRAMUSCULAR | Status: DC | PRN
  Administered 2015-11-09: 10 mg via INTRAVENOUS
  Administered 2015-11-09: 5 mg via INTRAVENOUS
  Administered 2015-11-09: 10 mg via INTRAVENOUS
  Administered 2015-11-09: 5 mg via INTRAVENOUS

## 2015-11-09 MED ORDER — PROPOFOL 10 MG/ML IV BOLUS
INTRAVENOUS | Status: DC | PRN
  Administered 2015-11-09: 100 mg via INTRAVENOUS

## 2015-11-09 MED ORDER — FENTANYL CITRATE (PF) 100 MCG/2ML IJ SOLN
INTRAMUSCULAR | Status: DC | PRN
  Administered 2015-11-09: 50 ug via INTRAVENOUS

## 2015-11-09 MED ORDER — PHENYLEPHRINE HCL 10 MG/ML IJ SOLN
INTRAMUSCULAR | Status: DC | PRN
  Administered 2015-11-09 (×2): 100 ug via INTRAVENOUS

## 2015-11-09 MED ORDER — SODIUM CHLORIDE 0.9 % IV SOLN
INTRAVENOUS | Status: DC
  Administered 2015-11-09 (×2): via INTRAVENOUS

## 2015-11-09 MED ORDER — SODIUM CHLORIDE 0.9 % IV SOLN: INTRAVENOUS | Status: DC

## 2015-11-09 MED ORDER — MIDAZOLAM HCL 5 MG/5ML IJ SOLN
INTRAMUSCULAR | Status: DC | PRN
  Administered 2015-11-09: 1 mg via INTRAVENOUS

## 2015-11-09 MED ORDER — PROPOFOL 500 MG/50ML IV EMUL
INTRAVENOUS | Status: DC | PRN
  Administered 2015-11-09: 180 ug/kg/min via INTRAVENOUS

## 2015-11-09 MED ORDER — GLYCOPYRROLATE 0.2 MG/ML IJ SOLN
INTRAMUSCULAR | Status: DC | PRN
  Administered 2015-11-09: 0.2 mg via INTRAVENOUS

## 2015-11-09 MED ORDER — EPHEDRINE SULFATE 50 MG/ML IJ SOLN: INTRAMUSCULAR | Status: DC | PRN

## 2015-11-09 NOTE — Op Note (Signed)
[facility] Gastroenterology Patient Name: Elonzo Villar Procedure Date: 11/09/2015 1:03 PM MRN: [id number] Account #: [id number] Date of Birth: 09-Feb-1946 Admit Type: Outpatient Age: 70 Room: [facility] ENDO ROOM 1 Gender: Male Note Status: Finalized Procedure:            Colonoscopy Indications:          Personal history of colonic polyps Providers:            Lollie Sails, MD Referring MD:         Dion Body (Referring MD) Medicines:            Monitored Anesthesia Care Complications:        No immediate complications. Procedure:            Pre-Anesthesia Assessment:                       - ASA Grade Assessment: III - A patient with severe                        systemic disease.                       After obtaining informed consent, the colonoscope was                        passed under direct vision. Throughout the procedure,                        the patient's blood pressure, pulse, and oxygen                        saturations were monitored continuously. The                        Colonoscope was introduced through the anus and                        advanced to the the cecum, identified by appendiceal                        orifice and ileocecal valve. The colonoscopy was                        performed without difficulty. The patient tolerated the                        procedure. Findings:      A 3 mm polyp was found in the proximal ascending colon. The polyp was       sessile. The polyp was removed with a cold biopsy forceps. Resection and       retrieval were complete.      Two flat polyps were found in the cecum. The polyps were 1 to 2 mm in       size. These polyps were removed with a cold biopsy forceps. Resection       and retrieval were complete.      A 4 mm polyp was found in the proximal ascending colon. The polyp was       flat. The polyp was removed with a cold snare. Resection and retrieval       were complete.  Multiple  small and large-mouthed diverticula were found in the sigmoid       colon, descending colon, transverse colon and ascending colon.      There was evidence of a prior end-to-end colo-colonic anastomosis at 20       cm proximal to the anus. This was patent and was characterized by       healthy appearing mucosa.      The digital rectal exam was normal.      Non-bleeding internal hemorrhoids were found during retroflexion and       during anoscopy. The hemorrhoids were small and Grade I (internal       hemorrhoids that do not prolapse). Impression:           - One 3 mm polyp in the proximal ascending colon,                        removed with a cold biopsy forceps. Resected and                        retrieved.                       - Two 1 to 2 mm polyps in the cecum, removed with a                        cold biopsy forceps. Resected and retrieved.                       - One 4 mm polyp in the proximal ascending colon,                        removed with a cold snare. Resected and retrieved.                       - Diverticulosis in the sigmoid colon, in the                        descending colon, in the transverse colon and in the                        ascending colon.                       - Patent end-to-end colo-colonic anastomosis,                        characterized by healthy appearing mucosa. Recommendation:       - Discharge patient to home. Procedure Code(s):    --- Professional ---                       [phone], Colonoscopy, flexible; with removal of tumor(s),                        polyp(s), or other lesion(s) by snare technique                       [id number], 59, Colonoscopy, flexible; with biopsy, single                        or multiple Diagnosis Code(s):    ---  Professional ---                       D12.2, Benign neoplasm of ascending colon                       D12.0, Benign neoplasm of cecum                       Z98.0, Intestinal bypass and anastomosis status                        Z86.010, Personal history of colonic polyps                       K57.30, Diverticulosis of large intestine without                        perforation or abscess without bleeding CPT copyright 2016 American Medical Association. All rights reserved. The codes documented in this report are preliminary and upon coder review may  be revised to meet current compliance requirements. Lollie Sails, MD 11/09/2015 1:39:14 PM This report has been signed electronically. Number of Addenda: 0 Note Initiated On: 11/09/2015 1:03 PM Scope Withdrawal Time: 0 hours 15 minutes 56 seconds  Total Procedure Duration: 0 hours 26 minutes 3 seconds       [facility]

## 2015-11-09 NOTE — Transfer of Care (Signed)
Immediate Anesthesia Transfer of Care Note  Patient: Taylor Ross  Procedure(s) Performed: Procedure(s): COLONOSCOPY WITH PROPOFOL (N/A)  Patient Location: PACU and Endoscopy Unit  Anesthesia Type:General  Level of Consciousness: awake, oriented and patient cooperative  Airway & Oxygen Therapy: Patient Spontanous Breathing and Patient connected to nasal cannula oxygen  Post-op Assessment: Report given to RN and Post -op Vital signs reviewed and stable  Post vital signs: Reviewed and stable  Last Vitals:  Vitals:   11/09/15 1226 11/09/15 1341  BP: (!) 161/75 (!) 115/58  Pulse: 72 60  Resp: 15 (!) 21  Temp: 36.2 C (!) 36 C    Last Pain:  Vitals:   11/09/15 1341  TempSrc: Tympanic         Complications: No apparent anesthesia complications

## 2015-11-09 NOTE — Anesthesia Preprocedure Evaluation (Addendum)
Anesthesia Evaluation  Patient identified by MRN, date of birth, ID band Patient awake    Reviewed: Allergy & Precautions, NPO status , Patient's Chart, lab work & pertinent test results  Airway Mallampati: III       Dental   Pulmonary asthma , sleep apnea ,           Cardiovascular Exercise Tolerance: Good hypertension, Pt. on medications and Pt. on home beta blockers      Neuro/Psych Hx of meningitus    GI/Hepatic GERD  Medicated,  Endo/Other  diabetesMorbid obesity  Renal/GU      Musculoskeletal   Abdominal   Peds  Hematology   Anesthesia Other Findings   Reproductive/Obstetrics                           Anesthesia Physical Anesthesia Plan  ASA: III  Anesthesia Plan: General   Post-op Pain Management:    Induction: Intravenous  Airway Management Planned: Natural Airway and Nasal Cannula  Additional Equipment:   Intra-op Plan:   Post-operative Plan:   Informed Consent: I have reviewed the patients History and Physical, chart, labs and discussed the procedure including the risks, benefits and alternatives for the proposed anesthesia with the patient or authorized representative who has indicated his/her understanding and acceptance.     Plan Discussed with: CRNA  Anesthesia Plan Comments:         Anesthesia Quick Evaluation

## 2015-11-09 NOTE — H&P (Signed)
Outpatient short stay form Pre-procedure 11/09/2015 12:59 PM Lollie Sails MD  Primary Physician: Dr. Netty Starring  Reason for visit:  Colonoscopy  History of present illness:  Patient is a 70 year old male presenting today for colonoscopy. She came in yesterday however prep was not good and we had to repeat the prep. He does not take aspirin products with the exception of 81 mg aspirin. He takes no blood thinning agents. He tolerated the reprep well.    Current Facility-Administered Medications:  .  0.9 %  sodium chloride infusion, , Intravenous, Continuous, Lollie Sails, MD .  0.9 %  sodium chloride infusion, , Intravenous, Continuous, Lollie Sails, MD  Prescriptions Prior to Admission  Medication Sig Dispense Refill Last Dose  . hydrochlorothiazide (HYDRODIURIL) 25 MG tablet Take 25 mg by mouth daily.   11/09/2015 at 0530  . metoprolol succinate (TOPROL-XL) 100 MG 24 hr tablet Take 100 mg by mouth daily.    11/09/2015 at 0530  . albuterol (VENTOLIN HFA) 108 (90 Base) MCG/ACT inhaler Inhale 2 puffs into the lungs every 4 (four) hours as needed.    11/07/2015 at Unknown time  . allopurinol (ZYLOPRIM) 300 MG tablet 300 mg daily.    11/07/2015 at Unknown time  . aspirin EC 81 MG tablet Take 81 mg by mouth daily.    11/07/2015 at Unknown time  . atorvastatin (LIPITOR) 10 MG tablet 10 mg daily.    11/07/2015 at Unknown time  . benzonatate (TESSALON) 200 MG capsule Take 200 mg by mouth 3 (three) times daily as needed for cough.   11/07/2015 at Unknown time  . budesonide-formoterol (SYMBICORT) 160-4.5 MCG/ACT inhaler Inhale 2 puffs into the lungs 2 (two) times daily.   11/07/2015 at Unknown time  . fenofibrate (TRICOR) 145 MG tablet Take 145 mg by mouth daily.    11/07/2015 at Unknown time  . HYDROcodone-homatropine (HYCODAN) 5-1.5 MG/5ML syrup Take 5 mLs by mouth every 6 (six) hours as needed for cough.   11/07/2015 at Unknown time  . losartan (COZAAR) 25 MG tablet Take 25 mg by mouth daily.     11/07/2015 at Unknown time  . tadalafil (CIALIS) 5 MG tablet Take 5 mg by mouth daily as needed for erectile dysfunction.   Past Month at Unknown time  . traMADol (ULTRAM) 50 MG tablet Take 50 mg by mouth every 6 (six) hours as needed.    11/08/2015 at 0515     Allergies  Allergen Reactions  . Amoxicillin-Pot Clavulanate Rash  . Moxifloxacin Other (See Comments)    Muscle Weakness, Inability to stand  . Prednisone Rash     Past Medical History:  Diagnosis Date  . Allergic rhinitis   . Bacterial meningitis   . Diabetes mellitus without complication ([place])   . GERD (gastroesophageal reflux disease)   . Gout   . Hyperlipidemia   . Hypertension   . Schwannoma     Review of systems:      Physical Exam    Heart and lungs: Regular rate and rhythm without rub or gallop, lungs are bilaterally clear.    HEENT: Normocephalic atraumatic eyes are anicteric    Other:     Pertinant exam for procedure: Soft nontender nondistended bowel sounds positive normoactive.    Planned proceedures: Colonoscopy and indicated procedures. I have discussed the risks benefits and complications of procedures to include not limited to bleeding, infection, perforation and the risk of sedation and the patient wishes to proceed.    Lollie Sails,  MD Gastroenterology 11/09/2015  12:59 PM

## 2015-11-09 NOTE — Anesthesia Postprocedure Evaluation (Signed)
Anesthesia Post Note  Patient: Taylor Ross  Procedure(s) Performed: Procedure(s) (LRB): COLONOSCOPY WITH PROPOFOL (N/A)  Patient location during evaluation: PACU Anesthesia Type: General Level of consciousness: awake Pain management: pain level controlled Vital Signs Assessment: post-procedure vital signs reviewed and stable Respiratory status: spontaneous breathing Cardiovascular status: stable Anesthetic complications: no    Last Vitals:  Vitals:   11/09/15 1351 11/09/15 1401  BP: (!) 143/67 139/71  Pulse: (!) 54 (!) 50  Resp: (!) 26 20  Temp:      Last Pain:  Vitals:   11/09/15 1341  TempSrc: Tympanic                 VAN STAVEREN,Tyleek Smick

## 2015-11-10 ENCOUNTER — Encounter: Payer: Self-pay | Admitting: Gastroenterology

## 2015-11-10 LAB — SURGICAL PATHOLOGY

## 2015-12-05 DIAGNOSIS — R911 Solitary pulmonary nodule: Secondary | ICD-10-CM | POA: Diagnosis not present

## 2015-12-05 DIAGNOSIS — R938 Abnormal findings on diagnostic imaging of other specified body structures: Secondary | ICD-10-CM | POA: Diagnosis not present

## 2015-12-05 DIAGNOSIS — J984 Other disorders of lung: Secondary | ICD-10-CM | POA: Diagnosis not present

## 2015-12-19 DIAGNOSIS — D3610 Benign neoplasm of peripheral nerves and autonomic nervous system, unspecified: Secondary | ICD-10-CM | POA: Diagnosis not present

## 2015-12-19 DIAGNOSIS — D333 Benign neoplasm of cranial nerves: Secondary | ICD-10-CM | POA: Diagnosis not present

## 2015-12-20 DIAGNOSIS — D3610 Benign neoplasm of peripheral nerves and autonomic nervous system, unspecified: Secondary | ICD-10-CM | POA: Diagnosis not present

## 2016-01-12 DIAGNOSIS — E1142 Type 2 diabetes mellitus with diabetic polyneuropathy: Secondary | ICD-10-CM | POA: Diagnosis not present

## 2016-01-12 DIAGNOSIS — S90111A Contusion of right great toe without damage to nail, initial encounter: Secondary | ICD-10-CM | POA: Diagnosis not present

## 2016-01-12 DIAGNOSIS — B351 Tinea unguium: Secondary | ICD-10-CM | POA: Diagnosis not present

## 2016-01-18 DIAGNOSIS — H40003 Preglaucoma, unspecified, bilateral: Secondary | ICD-10-CM | POA: Diagnosis not present

## 2016-01-18 DIAGNOSIS — H2513 Age-related nuclear cataract, bilateral: Secondary | ICD-10-CM | POA: Diagnosis not present

## 2016-01-18 DIAGNOSIS — H40019 Open angle with borderline findings, low risk, unspecified eye: Secondary | ICD-10-CM | POA: Diagnosis not present

## 2016-02-08 DIAGNOSIS — H25013 Cortical age-related cataract, bilateral: Secondary | ICD-10-CM | POA: Diagnosis not present

## 2016-02-08 DIAGNOSIS — H40013 Open angle with borderline findings, low risk, bilateral: Secondary | ICD-10-CM | POA: Diagnosis not present

## 2016-02-08 DIAGNOSIS — H35373 Puckering of macula, bilateral: Secondary | ICD-10-CM | POA: Diagnosis not present

## 2016-02-08 DIAGNOSIS — H35033 Hypertensive retinopathy, bilateral: Secondary | ICD-10-CM | POA: Diagnosis not present

## 2016-02-08 DIAGNOSIS — H2511 Age-related nuclear cataract, right eye: Secondary | ICD-10-CM | POA: Diagnosis not present

## 2016-02-08 DIAGNOSIS — H25011 Cortical age-related cataract, right eye: Secondary | ICD-10-CM | POA: Diagnosis not present

## 2016-02-08 DIAGNOSIS — H2513 Age-related nuclear cataract, bilateral: Secondary | ICD-10-CM | POA: Diagnosis not present

## 2016-02-13 DIAGNOSIS — R938 Abnormal findings on diagnostic imaging of other specified body structures: Secondary | ICD-10-CM | POA: Diagnosis not present

## 2016-02-13 DIAGNOSIS — J189 Pneumonia, unspecified organism: Secondary | ICD-10-CM | POA: Diagnosis not present

## 2016-02-27 DIAGNOSIS — M5431 Sciatica, right side: Secondary | ICD-10-CM | POA: Diagnosis not present

## 2016-02-27 DIAGNOSIS — E119 Type 2 diabetes mellitus without complications: Secondary | ICD-10-CM | POA: Diagnosis not present

## 2016-02-27 DIAGNOSIS — M5136 Other intervertebral disc degeneration, lumbar region: Secondary | ICD-10-CM | POA: Diagnosis not present

## 2016-03-07 DIAGNOSIS — M5431 Sciatica, right side: Secondary | ICD-10-CM | POA: Diagnosis not present

## 2016-03-08 ENCOUNTER — Other Ambulatory Visit: Payer: Self-pay | Admitting: Family Medicine

## 2016-03-08 DIAGNOSIS — M5431 Sciatica, right side: Secondary | ICD-10-CM

## 2016-03-13 DIAGNOSIS — H2511 Age-related nuclear cataract, right eye: Secondary | ICD-10-CM | POA: Diagnosis not present

## 2016-03-13 DIAGNOSIS — H25011 Cortical age-related cataract, right eye: Secondary | ICD-10-CM | POA: Diagnosis not present

## 2016-03-13 DIAGNOSIS — H25811 Combined forms of age-related cataract, right eye: Secondary | ICD-10-CM | POA: Diagnosis not present

## 2016-03-16 ENCOUNTER — Ambulatory Visit
Admission: RE | Admit: 2016-03-16 | Discharge: 2016-03-16 | Disposition: A | Discharge status: Home / Self Care | Payer: PPO | Source: Ambulatory Visit | Attending: Family Medicine | Admitting: Family Medicine

## 2016-03-16 DIAGNOSIS — M48061 Spinal stenosis, lumbar region without neurogenic claudication: Secondary | ICD-10-CM | POA: Insufficient documentation

## 2016-03-16 DIAGNOSIS — M5136 Other intervertebral disc degeneration, lumbar region: Secondary | ICD-10-CM | POA: Diagnosis not present

## 2016-03-16 DIAGNOSIS — M5431 Sciatica, right side: Secondary | ICD-10-CM | POA: Insufficient documentation

## 2016-03-16 DIAGNOSIS — M545 Low back pain: Secondary | ICD-10-CM | POA: Diagnosis not present

## 2016-03-16 DIAGNOSIS — M8938 Hypertrophy of bone, other site: Secondary | ICD-10-CM | POA: Diagnosis not present

## 2016-03-16 DIAGNOSIS — M5126 Other intervertebral disc displacement, lumbar region: Secondary | ICD-10-CM | POA: Insufficient documentation

## 2016-03-20 DIAGNOSIS — E119 Type 2 diabetes mellitus without complications: Secondary | ICD-10-CM | POA: Diagnosis not present

## 2016-03-20 DIAGNOSIS — Z8739 Personal history of other diseases of the musculoskeletal system and connective tissue: Secondary | ICD-10-CM | POA: Diagnosis not present

## 2016-03-20 DIAGNOSIS — N183 Chronic kidney disease, stage 3 (moderate): Secondary | ICD-10-CM | POA: Diagnosis not present

## 2016-03-20 DIAGNOSIS — E782 Mixed hyperlipidemia: Secondary | ICD-10-CM | POA: Diagnosis not present

## 2016-03-21 DIAGNOSIS — M5416 Radiculopathy, lumbar region: Secondary | ICD-10-CM | POA: Diagnosis not present

## 2016-03-23 DIAGNOSIS — M5116 Intervertebral disc disorders with radiculopathy, lumbar region: Secondary | ICD-10-CM | POA: Diagnosis not present

## 2016-03-26 DIAGNOSIS — T7840XA Allergy, unspecified, initial encounter: Secondary | ICD-10-CM | POA: Diagnosis not present

## 2016-03-27 ENCOUNTER — Ambulatory Visit: Payer: PPO

## 2016-03-27 DIAGNOSIS — E782 Mixed hyperlipidemia: Secondary | ICD-10-CM | POA: Diagnosis not present

## 2016-03-27 DIAGNOSIS — Z0000 Encounter for general adult medical examination without abnormal findings: Secondary | ICD-10-CM | POA: Diagnosis not present

## 2016-03-27 DIAGNOSIS — Z23 Encounter for immunization: Secondary | ICD-10-CM | POA: Diagnosis not present

## 2016-03-27 DIAGNOSIS — E119 Type 2 diabetes mellitus without complications: Secondary | ICD-10-CM | POA: Diagnosis not present

## 2016-03-27 DIAGNOSIS — N183 Chronic kidney disease, stage 3 (moderate): Secondary | ICD-10-CM | POA: Diagnosis not present

## 2016-03-29 ENCOUNTER — Ambulatory Visit: Payer: PPO

## 2016-03-29 DIAGNOSIS — H25012 Cortical age-related cataract, left eye: Secondary | ICD-10-CM | POA: Diagnosis not present

## 2016-03-29 DIAGNOSIS — H2512 Age-related nuclear cataract, left eye: Secondary | ICD-10-CM | POA: Diagnosis not present

## 2016-04-10 DIAGNOSIS — H25012 Cortical age-related cataract, left eye: Secondary | ICD-10-CM | POA: Diagnosis not present

## 2016-04-10 DIAGNOSIS — H25812 Combined forms of age-related cataract, left eye: Secondary | ICD-10-CM | POA: Diagnosis not present

## 2016-04-10 DIAGNOSIS — H2512 Age-related nuclear cataract, left eye: Secondary | ICD-10-CM | POA: Diagnosis not present

## 2016-04-16 DIAGNOSIS — T7840XD Allergy, unspecified, subsequent encounter: Secondary | ICD-10-CM | POA: Diagnosis not present

## 2016-04-18 DIAGNOSIS — M5116 Intervertebral disc disorders with radiculopathy, lumbar region: Secondary | ICD-10-CM | POA: Diagnosis not present

## 2016-04-18 DIAGNOSIS — M48061 Spinal stenosis, lumbar region without neurogenic claudication: Secondary | ICD-10-CM | POA: Diagnosis not present

## 2016-04-18 DIAGNOSIS — M5416 Radiculopathy, lumbar region: Secondary | ICD-10-CM | POA: Diagnosis not present

## 2016-04-19 DIAGNOSIS — T7840XA Allergy, unspecified, initial encounter: Secondary | ICD-10-CM | POA: Diagnosis not present

## 2016-05-03 DIAGNOSIS — M5416 Radiculopathy, lumbar region: Secondary | ICD-10-CM | POA: Diagnosis not present

## 2016-05-15 ENCOUNTER — Ambulatory Visit: Payer: PPO | Attending: Neurosurgery | Admitting: Physical Therapy

## 2016-05-15 DIAGNOSIS — M2560 Stiffness of unspecified joint, not elsewhere classified: Secondary | ICD-10-CM | POA: Diagnosis not present

## 2016-05-15 DIAGNOSIS — M5441 Lumbago with sciatica, right side: Secondary | ICD-10-CM | POA: Diagnosis not present

## 2016-05-15 DIAGNOSIS — M6281 Muscle weakness (generalized): Secondary | ICD-10-CM | POA: Insufficient documentation

## 2016-05-15 NOTE — Therapy (Signed)
[facility] [address] [place], [place], [address] Phone: [phone]   Fax:  [phone]  Physical Therapy Evaluation  Patient Details  Name: Taylor Ross MRN: [id number] Date of Birth: Mar 05, 1946 Referring Provider: Meade Maw, MD  Encounter Date: 05/15/2016      PT End of Session - 05/15/16 1307    Visit Number 1   Number of Visits 8   Date for PT Re-Evaluation 06/12/16   Authorization - Visit Number 1   Authorization - Number of Visits 10   PT Start Time 1026   PT Stop Time [id number]   PT Time Calculation (min) 56 min   Activity Tolerance Patient tolerated treatment well;No increased pain   Behavior During Therapy WFL for tasks assessed/performed      Past Medical History:  Diagnosis Date  . Allergic rhinitis   . Bacterial meningitis   . Diabetes mellitus without complication ([place])   . GERD (gastroesophageal reflux disease)   . Gout   . Hyperlipidemia   . Hypertension   . Schwannoma     Past Surgical History:  Procedure Laterality Date  . BRAIN SURGERY  2006 x 3; 2010 x 1   Benign Tumor Removal [facility])  . COLON SURGERY  2008   Dr. Tamala Julian [facility])  . COLONOSCOPY WITH PROPOFOL N/A 11/08/2015   Procedure: COLONOSCOPY WITH PROPOFOL;  Surgeon: Lollie Sails, MD;  Location: [facility] ENDOSCOPY;  Service: Endoscopy;  Laterality: N/A;  . COLONOSCOPY WITH PROPOFOL N/A 11/09/2015   Procedure: COLONOSCOPY WITH PROPOFOL;  Surgeon: Lollie Sails, MD;  Location: [facility] ENDOSCOPY;  Service: Endoscopy;  Laterality: N/A;  . FLEXIBLE BRONCHOSCOPY N/A 10/04/2015   Procedure: FLEXIBLE BRONCHOSCOPY;  Surgeon: Wilhelmina Mcardle, MD;  Location: [facility] ORS;  Service: Pulmonary;  Laterality: N/A;    There were no vitals filed for this visit.       Subjective Assessment - 05/15/16 1224    Subjective Pt. presented to PT clinic with no assistive device and reports 3/10 pain in R low back/hip. Pt. reports his pain as of recent  has been very bad (8/10) but has decreased greatly in the last week. Pt. 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 The pain spread down his RLE with a burning/shooting sensation and was easily aggravated. Pt. has hx of brain/neurosurguries w/ no reported complications. Pt. was previously active with a walking program and would like to return to that once R low back/hip pain subsides. Pt. is scheduled for laminectomy 3/14 and would like to avoid having procedure done.    Limitations Sitting;Lifting;Standing;Walking;House hold activities   How long can you sit comfortably? 1 hr.    Patient Stated Goals Pt. would like to return to previous walking program (6x/week 2 mi/day)   Currently in Pain? Yes   Pain Score 3    Pain Location Back   Pain Orientation Right;Lower   Pain Descriptors / Indicators Dull;Discomfort;Sore   Pain Type Chronic pain   Pain Radiating Towards R knee/foot   Pain Onset More than a month ago   Pain Frequency Intermittent   Aggravating Factors  bending over, sitting in one position for too long             [facility] PT Assessment - 05/15/16 0001      Assessment  Medical Diagnosis Right lumbar radiculopathy   Referring Provider Meade Maw, MD   Onset Date/Surgical Date 03/03/16     Precautions   Precautions None     Restrictions   Weight Bearing Restrictions No     Balance Screen   Has the patient fallen in the past 6 months No   Has the patient had a decrease in activity level because of a fear of falling?  Yes   Is the patient reluctant to leave their home because of a fear of falling?  No       HEP issued (see handouts). Proper supine lumbar stretching/therex. Discussed next tx. Session with focus on manual tx, therex, and endurance activities. Pt. Will benefit from  skilled PT in order to decease pain, increase LE/lumbar mobility stability to improve pain-free independence in gait and household activities.         PT Education - 05/15/16 1307    Education provided Yes   Education Details see handout   Person(s) Educated Patient   Methods Explanation;Demonstration;Tactile cues   Comprehension Verbalized understanding;Returned demonstration             PT Long Term Goals - 05/15/16 1338      PT LONG TERM GOAL #1   Title Pt. will decrease MODI score to <28% to improve self-perceived disability to allow for increased ADL function.    Baseline 05/16/15: 38%   Time 4   Period Weeks   Status New     PT LONG TERM GOAL #2   Title Pt. will report 1/10 pain in R low back to promote return to daily exercise.    Baseline Pt. reports 3/10 R low back pain   Time 4   Period Weeks   Status New     PT LONG TERM GOAL #3   Title Pt. will demonstrate functional B hamstring length of 60 deg. to allow for proper body mechanics in all positions   Baseline B hamstring length ~50 deg.    Time 4   Period Weeks   Status New     PT LONG TERM GOAL #4   Title Pt. will be able to bend over and pick up object from floor with proper body mechanics and no radicular sx. in RLE to allow for return to yardwork/housework.   Baseline Pt. reports radicular pain down R LE while bending forward to pick up object from floor.    Time 4   Period Weeks             Plan - 05/15/16 1309    Clinical Impression Statement Pt. is a pleasant 71 y/o male who presents to physical therapy evaluation with R-sided low back pain with radiculopathy since 03/03/16. Pt. onset started after a day of spreading deer corn outside and lifting heavy bags. Pt. has a scheduled laminectomy 06/21/15 and has personal goals for PT to prevent surgery from occurring. Pt. has 3/10 resting pain in R low back and hip with no current radiation into RLE. Pain has been improving over the past week but  still is aggravated by forward lumbar flexion and sitting for long period of time (>1 hr). Pain alleviated only by changing of positions and supine knee to chest exercise. Pt. was previously active in walking program 6x/week but has eliminated physical activity since onset of LBP. Lumbar/thoracic AROM WFL with no increased pain, lumbar flexion produced pulling/pain in R LB. Pt. presents with B hamstring tightness with SLR ~50 deg. R passive SLR  reproduced concordant R LBP and hip pain. Pt. currently taking Tylenol in a.m. when pain is at its highest throughout the day. Pt. tender to palpation in prone position to the L4/L5 region with grade I/II CPAs/UPAs with (+) segmental hypomobility of L3,4,5, T12.  R sided tenderness to low back musculature noted. MODI: 38% self-perceived moderate disability.  Pt issued HEP focusing on supine lumbar stretching and core stabilization with no increased LBP. Pt. would benefit from skilled physical therapy for pain control, to increase lumbar ROM, and increase gross strength in order to possibly prevent scheduled laminectomy.    Rehab Potential Good   Clinical Impairments Affecting Rehab Potential improving sx, motivation, previous active lifestyle   PT Frequency 2x / week   PT Duration 4 weeks   PT Treatment/Interventions ADLs/Self Care Home Management;Cryotherapy;Moist Heat;Electrical Stimulation;Traction;Gait training;Therapeutic activities;Therapeutic exercise;Neuromuscular re-education;Balance training;Patient/family education;Manual techniques;Passive range of motion   PT Next Visit Plan Reassess HEP. Progress therex/manual tx. 6 min walk test potentially   PT Home Exercise Plan see handout   Consulted and Agree with Plan of Care Patient      Patient will benefit from skilled therapeutic intervention in order to improve the following deficits and impairments:  Abnormal gait, Decreased balance, Decreased endurance, Decreased mobility, Hypomobility, Decreased range  of motion, Improper body mechanics, Obesity, Decreased activity tolerance, Decreased strength, Impaired flexibility, Pain, Postural dysfunction  Visit Diagnosis: Right-sided low back pain with right-sided sciatica, unspecified chronicity  Muscle weakness (generalized)  Joint stiffness      G-Codes - 2016-06-02 1540    Functional Assessment Tool Used Clinical impression/ MODI/ pain/ joint stiffness   Functional Limitation Mobility: Walking and moving around   Mobility: Walking and Moving Around Current Status [phone]) At least 20 percent but less than 40 percent impaired, limited or restricted   Mobility: Walking and Moving Around Goal Status [id number]) At least 1 percent but less than 20 percent impaired, limited or restricted       Problem List Patient Active Problem List   Diagnosis Date Noted  . Controlled type 2 diabetes mellitus without complication ([place]) [id number]  . Neurilemmoma 10/07/2015  . History of colon polyps 10/07/2015  . Alimentary obesity 10/07/2015  . Combined fat and carbohydrate induced hyperlipemia 10/07/2015  . H/O: gout 10/07/2015  . ED (erectile dysfunction) of organic origin 10/07/2015  . Allergic rhinitis 10/07/2015  . Chronic kidney disease (CKD), stage III (moderate) 03/23/2015  . H/O meningitis 09/08/2006   Pura Spice, PT, DPT # [id number] Willodean Rosenthal, SPT June 02, 2016, 3:42 PM  [place] [facility] [address] [place], [place], [address] Phone: [phone]   Fax:  [phone]  Name: Taylor Ross MRN: [id number] Date of Birth: [birth year]

## 2016-05-16 DIAGNOSIS — T17908S Unspecified foreign body in respiratory tract, part unspecified causing other injury, sequela: Secondary | ICD-10-CM | POA: Diagnosis not present

## 2016-05-16 DIAGNOSIS — I252 Old myocardial infarction: Secondary | ICD-10-CM | POA: Diagnosis not present

## 2016-05-16 DIAGNOSIS — X58XXXS Exposure to other specified factors, sequela: Secondary | ICD-10-CM | POA: Diagnosis not present

## 2016-05-16 DIAGNOSIS — Z7982 Long term (current) use of aspirin: Secondary | ICD-10-CM | POA: Diagnosis not present

## 2016-05-16 DIAGNOSIS — Z86010 Personal history of colonic polyps: Secondary | ICD-10-CM | POA: Diagnosis not present

## 2016-05-16 DIAGNOSIS — I2510 Atherosclerotic heart disease of native coronary artery without angina pectoris: Secondary | ICD-10-CM | POA: Diagnosis not present

## 2016-05-16 DIAGNOSIS — T17828D Food in other parts of respiratory tract causing other injury, subsequent encounter: Secondary | ICD-10-CM | POA: Diagnosis not present

## 2016-05-16 DIAGNOSIS — T17808S Unspecified foreign body in other parts of respiratory tract causing other injury, sequela: Secondary | ICD-10-CM | POA: Diagnosis not present

## 2016-05-18 ENCOUNTER — Ambulatory Visit: Payer: PPO | Admitting: Physical Therapy

## 2016-05-18 ENCOUNTER — Encounter: Payer: Self-pay | Admitting: Physical Therapy

## 2016-05-18 DIAGNOSIS — M5441 Lumbago with sciatica, right side: Secondary | ICD-10-CM | POA: Diagnosis not present

## 2016-05-18 DIAGNOSIS — M6281 Muscle weakness (generalized): Secondary | ICD-10-CM

## 2016-05-18 DIAGNOSIS — M2560 Stiffness of unspecified joint, not elsewhere classified: Secondary | ICD-10-CM

## 2016-05-18 NOTE — Therapy (Signed)
[place] [facility] [address] [place], [place], [address] Phone: [phone]   Fax:  [phone]  Physical Therapy Treatment  Patient Details  Name: Taylor Ross MRN: [id number] Date of Birth: 01-Oct-1945 Referring Provider: Meade Maw, MD  Encounter Date: 05/18/2016      PT End of Session - 05/18/16 0851    Visit Number 2   Number of Visits 8   Date for PT Re-Evaluation 06/12/16   Authorization - Visit Number 2   Authorization - Number of Visits 10   PT Start Time 0848   PT Stop Time 0939   PT Time Calculation (min) 51 min   Activity Tolerance Patient tolerated treatment well;No increased pain   Behavior During Therapy WFL for tasks assessed/performed      Past Medical History:  Diagnosis Date  . Allergic rhinitis   . Bacterial meningitis   . Diabetes mellitus without complication ([place])   . GERD (gastroesophageal reflux disease)   . Gout   . Hyperlipidemia   . Hypertension   . Schwannoma     Past Surgical History:  Procedure Laterality Date  . BRAIN SURGERY  2006 x 3; 2010 x 1   Benign Tumor Removal [facility])  . COLON SURGERY  2008   Dr. Tamala Julian [facility])  . COLONOSCOPY WITH PROPOFOL N/A 11/08/2015   Procedure: COLONOSCOPY WITH PROPOFOL;  Surgeon: Lollie Sails, MD;  Location: [facility] ENDOSCOPY;  Service: Endoscopy;  Laterality: N/A;  . COLONOSCOPY WITH PROPOFOL N/A 11/09/2015   Procedure: COLONOSCOPY WITH PROPOFOL;  Surgeon: Lollie Sails, MD;  Location: [facility] ENDOSCOPY;  Service: Endoscopy;  Laterality: N/A;  . FLEXIBLE BRONCHOSCOPY N/A 10/04/2015   Procedure: FLEXIBLE BRONCHOSCOPY;  Surgeon: Wilhelmina Mcardle, MD;  Location: [facility] ORS;  Service: Pulmonary;  Laterality: N/A;    There were no vitals filed for this visit.      Subjective Assessment - 05/18/16 0848    Subjective Pt. reports no R sided back/hip pain at this time.  Pt. states yesterday morning he had an increase in R hip/glut. pain at 2AM in  the morning but pain resolved once he got out of  bed.     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 The pain spread down his RLE with a burning/shooting sensation and was easily aggravated. Pt. has hx of brain/neurosurguries w/ no reported complications. Pt. was previously active with a walking program and would like to return to that once R low back/hip pain subsides. Pt. is scheduled for laminectomy 3/14 and would like to avoid having procedure done.    Limitations Sitting;Lifting;Standing;Walking;House hold activities   How long can you sit comfortably? 1 hr.    Patient Stated Goals Pt. would like to return to previous walking program (6x/week 2 mi/day)   Currently in Pain? No/denies       OBJECTIVE:  There.ex.: Scifit L4 10 min. B UE/LE (warm-up/no charge)- HR 89 bpm.  Reviewed HEP (TrA ex. With marching/ pelvic tilts/ open book/ B knee to chest).  Manual tx.:  Supine LE/lumbar stretches (focusing on trunk rotn./ hamstring/ piriformis)- static holds to pain tolerable range.  Prone grade II-III PA mobs. To low thoracic/lumbar region 2x15 sec.  STM to R lumbar/ superior R hip musculature (as tolerated)- tenderness but not pain reported.  ICE to low back in seated position after tx. For 10 min.  Pt response for medical necessity:  Pt. Benefits from progression of LE/lumbar flexibility and strengthening ex. Program to improve pain-free mobility.  Moderate hypomobility/ tenderness in LE/lumbar region.          PT Long Term Goals - 05/15/16 1338      PT LONG TERM GOAL #1   Title Pt. will decrease MODI score to <28% to improve self-perceived disability to allow for increased ADL function.    Baseline 05/16/15: 38%   Time 4   Period Weeks   Status New     PT LONG TERM GOAL #2   Title Pt. will report 1/10 pain in R low back to promote return to daily exercise.    Baseline Pt. reports 3/10 R low back  pain   Time 4   Period Weeks   Status New     PT LONG TERM GOAL #3   Title Pt. will demonstrate functional B hamstring length of 60 deg. to allow for proper body mechanics in all positions   Baseline B hamstring length ~50 deg.    Time 4   Period Weeks   Status New     PT LONG TERM GOAL #4   Title Pt. will be able to bend over and pick up object from floor with proper body mechanics and no radicular sx. in RLE to allow for return to yardwork/housework.   Baseline Pt. reports radicular pain down R LE while bending forward to pick up object from floor.    Time 4   Period Weeks               Plan - 05/18/16 [id number]    Clinical Impression Statement No increase c/o pain in R lumbar/ hip region today.  Pt. remains with moderate hypomobility in thoracic/lumbar spine and paraspinal musculature.  Pt. demonstrates good technique with stretches/ HEP.   Pt. instructed to continue with HEP and ice R lumbar/ hip region 2-3x/day for pain mgmt/ decrease inflammation.     Rehab Potential Good   Clinical Impairments Affecting Rehab Potential improving sx, motivation, previous active lifestyle   PT Frequency 2x / week   PT Duration 4 weeks   PT Treatment/Interventions ADLs/Self Care Home Management;Cryotherapy;Moist Heat;Electrical Stimulation;Traction;Gait training;Therapeutic activities;Therapeutic exercise;Neuromuscular re-education;Balance training;Patient/family education;Manual techniques;Passive range of motion   PT Next Visit Plan Progress therex/manual tx. 6 min walk test potentially   PT Home Exercise Plan see handout   Consulted and Agree with Plan of Care Patient      Patient will benefit from skilled therapeutic intervention in order to improve the following deficits and impairments:  Abnormal gait, Decreased balance, Decreased endurance, Decreased mobility, Hypomobility, Decreased range of motion, Improper body mechanics, Obesity, Decreased activity tolerance, Decreased strength,  Impaired flexibility, Pain, Postural dysfunction  Visit Diagnosis: Right-sided low back pain with right-sided sciatica, unspecified chronicity  Muscle weakness (generalized)  Joint stiffness     Problem List Patient Active Problem List   Diagnosis Date Noted  . Controlled type 2 diabetes mellitus without complication ([place]) [id number]  . Neurilemmoma 10/07/2015  . History of colon polyps 10/07/2015  . Alimentary obesity 10/07/2015  . Combined fat and carbohydrate induced hyperlipemia 10/07/2015  . H/O: gout 10/07/2015  . ED (erectile dysfunction) of organic origin 10/07/2015  . Allergic rhinitis 10/07/2015  . Chronic kidney disease (CKD), stage III (moderate) 03/23/2015  . H/O meningitis 09/08/2006   Pura Spice, PT, DPT # [id number] Willodean Rosenthal, SPT 05/18/2016, 10:26 AM  [place]  [facility] [address] [place], [place], [address] Phone: [phone]   Fax:  [phone]  Name: Taylor Ross MRN: [id number] Date of Birth: 1945-09-29

## 2016-05-22 ENCOUNTER — Ambulatory Visit: Payer: PPO | Admitting: Physical Therapy

## 2016-05-22 DIAGNOSIS — M5441 Lumbago with sciatica, right side: Secondary | ICD-10-CM | POA: Diagnosis not present

## 2016-05-22 DIAGNOSIS — M2560 Stiffness of unspecified joint, not elsewhere classified: Secondary | ICD-10-CM

## 2016-05-22 DIAGNOSIS — M6281 Muscle weakness (generalized): Secondary | ICD-10-CM

## 2016-05-22 NOTE — Therapy (Signed)
[facility] [address] [place], [place], [address] Phone: [phone]   Fax:  [phone]  Physical Therapy Treatment  Patient Details  Name: Taylor Ross MRN: [id number] Date of Birth: 1946-03-16 Referring Provider: Meade Maw, MD  Encounter Date: 05/22/2016      PT End of Session - 05/22/16 0806    Visit Number 3   Number of Visits 8   Date for PT Re-Evaluation 06/12/16   Authorization - Visit Number 3   Authorization - Number of Visits 10   PT Start Time 0801   PT Stop Time [id number]   PT Time Calculation (min) 57 min   Activity Tolerance Patient tolerated treatment well;No increased pain   Behavior During Therapy WFL for tasks assessed/performed      Past Medical History:  Diagnosis Date  . Allergic rhinitis   . Bacterial meningitis   . Diabetes mellitus without complication ([place])   . GERD (gastroesophageal reflux disease)   . Gout   . Hyperlipidemia   . Hypertension   . Schwannoma     Past Surgical History:  Procedure Laterality Date  . BRAIN SURGERY  2006 x 3; 2010 x 1   Benign Tumor Removal [facility])  . COLON SURGERY  2008   Dr. Tamala Julian [facility])  . COLONOSCOPY WITH PROPOFOL N/A 11/08/2015   Procedure: COLONOSCOPY WITH PROPOFOL;  Surgeon: Lollie Sails, MD;  Location: [facility] ENDOSCOPY;  Service: Endoscopy;  Laterality: N/A;  . COLONOSCOPY WITH PROPOFOL N/A 11/09/2015   Procedure: COLONOSCOPY WITH PROPOFOL;  Surgeon: Lollie Sails, MD;  Location: [facility] ENDOSCOPY;  Service: Endoscopy;  Laterality: N/A;  . FLEXIBLE BRONCHOSCOPY N/A 10/04/2015   Procedure: FLEXIBLE BRONCHOSCOPY;  Surgeon: Wilhelmina Mcardle, MD;  Location: [facility] ORS;  Service: Pulmonary;  Laterality: N/A;    There were no vitals filed for this visit.      Subjective Assessment - 05/22/16 0803    Subjective Pt reports being compliant with HEP and has started walking more to help with pain control.    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 The pain spread down his RLE with a burning/shooting sensation and was easily aggravated. Pt. has hx of brain/neurosurguries w/ no reported complications. Pt. was previously active with a walking program and would like to return to that once R low back/hip pain subsides. Pt. is scheduled for laminectomy 3/14 and would like to avoid having procedure done.    Limitations Sitting;Lifting;Standing;Walking;House hold activities   How long can you sit comfortably? 1 hr.    Patient Stated Goals Pt. would like to return to previous walking program (6x/week 2 mi/day)   Currently in Pain? Yes   Pain Score 2    Pain Location Back   Pain Orientation Right;Lower   Pain Descriptors / Indicators Dull;Discomfort   Pain Type Chronic pain   Pain Onset More than a month ago   Pain Frequency Intermittent      OBJECTIVE:   Therex:  Standing hamstring stretch in // bars with front leg on 3" step 1x60 sec  Side steps in // bars to both directions 2x with BUE and verbal cues for breathing, body mechanics, and posture. Occasional shuffle step due to weakness Step over grapevine in parallel bars with BUE support, 2x each direction. Cues for body mechanics, lifting anterior leg, and breathing. Walking laps to assess functional carryover from interventions Trunk  forward lean, causes pain, terminate activity Trunk posterior lean/standing extensions, relieves pain, repeated 10x.  Trunk Side bend 10x each alternating side. Cues for keeping core tight, feeling stretch, and breathing. Trunk rotation side to side 10x. Cues for speed,noted left shoulder hike with right rotation.   Supine heel slides, single leg bicycles, and SLR with TA contraction 10x each R/L  Manual tx.:  Supine LE/lumbar stretches (focusing on trunk rotn./ hamstring/ piriformis)- static holds to pain tolerable range. Reviewed new HEP in supine.    New  HEP issued and discussed w/ pt. Pt. Will continue home stretching program and benefit from PT for progression of LE/lumbar flexibility and strengthening ex. Program to improve pain-free mobility.       PT Long Term Goals - 05/15/16 1338      PT LONG TERM GOAL #1   Title Pt. will decrease MODI score to <28% to improve self-perceived disability to allow for increased ADL function.    Baseline 05/16/15: 38%   Time 4   Period Weeks   Status New     PT LONG TERM GOAL #2   Title Pt. will report 1/10 pain in R low back to promote return to daily exercise.    Baseline Pt. reports 3/10 R low back pain   Time 4   Period Weeks   Status New     PT LONG TERM GOAL #3   Title Pt. will demonstrate functional B hamstring length of 60 deg. to allow for proper body mechanics in all positions   Baseline B hamstring length ~50 deg.    Time 4   Period Weeks   Status New     PT LONG TERM GOAL #4   Title Pt. will be able to bend over and pick up object from floor with proper body mechanics and no radicular sx. in RLE to allow for return to yardwork/housework.   Baseline Pt. reports radicular pain down R LE while bending forward to pick up object from floor.    Time 4   Period Weeks               Plan - 05/22/16 [id number]    Clinical Impression Statement Pt. presented to physical therapy today with decreased R LBP (2/10) with some pain at home only when changing position in his sleep. Pt has been compliant with HEP and has no increased pain at home when performing. Pt. instructed on standing stretching exercises focusing on stretching hamstring and hip flexors. AROM reassessed in standing with no incr. pain with ext, SB, rot. Quick pain onset with forward lumbar flexion. Pt. progressed with supine core stabilization program. Pt. able to demonstrate good abdominal control during single leg bicycles, heel slides, and SLR. Pt. cued to maintain core contraction was able to maintain throughout. Pt. able to  complete sidelying clamshells w/ no radicular sx and proper form with cuing to maintain trunk in sidelying position, only raising the top knee. Pt. instructed to continue with new HEP and icing to lumbar region to control inflammation.    Rehab Potential Good   Clinical Impairments Affecting Rehab Potential improving sx, motivation, previous active lifestyle   PT Frequency 2x / week   PT Duration 4 weeks   PT Treatment/Interventions ADLs/Self Care Home Management;Cryotherapy;Moist Heat;Electrical Stimulation;Traction;Gait training;Therapeutic activities;Therapeutic exercise;Neuromuscular re-education;Balance training;Patient/family education;Manual techniques;Passive range of motion   PT Next Visit Plan Progress therex/manual tx. 6 min walk test potentially   PT Home Exercise Plan see handout   Consulted and Agree  with Plan of Care Patient      Patient will benefit from skilled therapeutic intervention in order to improve the following deficits and impairments:  Abnormal gait, Decreased balance, Decreased endurance, Decreased mobility, Hypomobility, Decreased range of motion, Improper body mechanics, Obesity, Decreased activity tolerance, Decreased strength, Impaired flexibility, Pain, Postural dysfunction  Visit Diagnosis: Right-sided low back pain with right-sided sciatica, unspecified chronicity  Muscle weakness (generalized)  Joint stiffness     Problem List Patient Active Problem List   Diagnosis Date Noted  . Controlled type 2 diabetes mellitus without complication ([place]) [id number]  . Neurilemmoma 10/07/2015  . History of colon polyps 10/07/2015  . Alimentary obesity 10/07/2015  . Combined fat and carbohydrate induced hyperlipemia 10/07/2015  . H/O: gout 10/07/2015  . ED (erectile dysfunction) of organic origin 10/07/2015  . Allergic rhinitis 10/07/2015  . Chronic kidney disease (CKD), stage III (moderate) 03/23/2015  . H/O meningitis 09/08/2006   Pura Spice, PT, DPT  # [id number] Willodean Rosenthal, SPT 05/23/2016, 8:00 AM  [facility] [address] [place], [place], [address] Phone: [phone]   Fax:  [phone]  Name: Taylor Ross MRN: [id number] Date of Birth: [birth year]

## 2016-05-24 ENCOUNTER — Ambulatory Visit: Payer: PPO | Admitting: Physical Therapy

## 2016-05-24 DIAGNOSIS — M5441 Lumbago with sciatica, right side: Secondary | ICD-10-CM

## 2016-05-24 DIAGNOSIS — M2560 Stiffness of unspecified joint, not elsewhere classified: Secondary | ICD-10-CM

## 2016-05-24 DIAGNOSIS — M6281 Muscle weakness (generalized): Secondary | ICD-10-CM

## 2016-05-24 NOTE — Therapy (Addendum)
[facility] [address] [place], [place], [address] Phone: [phone]   Fax:  [phone]  Physical Therapy Treatment  Patient Details  Name: Taylor Ross MRN: [id number] Date of Birth: [birth year] Referring Provider: Meade Maw, MD  Encounter Date: 05/24/2016      PT End of Session - 05/24/16 0809    Visit Number 4   Number of Visits 8   Date for PT Re-Evaluation 06/12/16   Authorization - Visit Number 4   Authorization - Number of Visits 10   PT Start Time 0805   PT Stop Time 0903   PT Time Calculation (min) 58 min   Activity Tolerance Patient tolerated treatment well;No increased pain   Behavior During Therapy WFL for tasks assessed/performed      Past Medical History:  Diagnosis Date  . Allergic rhinitis   . Bacterial meningitis   . Diabetes mellitus without complication ([place])   . GERD (gastroesophageal reflux disease)   . Gout   . Hyperlipidemia   . Hypertension   . Schwannoma     Past Surgical History:  Procedure Laterality Date  . BRAIN SURGERY  2006 x 3; 2010 x 1   Benign Tumor Removal [facility])  . COLON SURGERY  2008   Dr. Tamala Julian [facility])  . COLONOSCOPY WITH PROPOFOL N/A 11/08/2015   Procedure: COLONOSCOPY WITH PROPOFOL;  Surgeon: Lollie Sails, MD;  Location: [facility] ENDOSCOPY;  Service: Endoscopy;  Laterality: N/A;  . COLONOSCOPY WITH PROPOFOL N/A 11/09/2015   Procedure: COLONOSCOPY WITH PROPOFOL;  Surgeon: Lollie Sails, MD;  Location: [facility] ENDOSCOPY;  Service: Endoscopy;  Laterality: N/A;  . FLEXIBLE BRONCHOSCOPY N/A 10/04/2015   Procedure: FLEXIBLE BRONCHOSCOPY;  Surgeon: Wilhelmina Mcardle, MD;  Location: [facility] ORS;  Service: Pulmonary;  Laterality: N/A;    There were no vitals filed for this visit.      Subjective Assessment - 05/24/16 0807    Subjective Pt. reports no pain today and is sore after doing HEP.    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 The pain spread down his RLE with a burning/shooting sensation and was easily aggravated. Pt. has hx of brain/neurosurguries w/ no reported complications. Pt. was previously active with a walking program and would like to return to that once R low back/hip pain subsides. Pt. is scheduled for laminectomy 3/14 and would like to avoid having procedure done.    Limitations Sitting;Lifting;Standing;Walking;House hold activities   Patient Stated Goals Pt. would like to return to previous walking program (6x/week 2 mi/day)   Currently in Pain? No/denies     OBJECTIVE  Manual tx: Supine LE/lumbar stretches (10 min.)- reassessment of muscle length.  Prone spinal mobility assessment (grade II-II Mobs. To mid-thoracic/lumbar spine).  STM to paraspinals.  Moderate hypomobility.  No reproduction of symptoms.    There.Ex.: Functional body mechanics lifting: Lifting box from 18" surface with focus on straight back, bending knees, core tightness. 2x10 Wall squats 2x10. Required assistance putting feet into correct position and cueing for body mechanics. Seated marches on theraball 1x10. BUE support cues for breathing and body mechanics Seated heel raises on theraball 1x10. BUE support and cues for breathing Forward lunges in // bars with BUE support 2x length of bars Side squat walking in // bars 2xlength of bars.  Standing hamstring stretching 30 sec. Static holds 2x R/L  Ambulating between interventions to assess  carryover. Pt. Reports no increased pain with ambulation and shows a consistent step pattern with good B heelstrike/toe-off. Pt. Wore new tennis shoes to clinic today.          PT Long Term Goals - 05/15/16 1338      PT LONG TERM GOAL #1   Title Pt. will decrease MODI score to <28% to improve self-perceived disability to allow for increased ADL function.    Baseline 05/16/15: 38%   Time 4   Period Weeks   Status New     PT LONG TERM  GOAL #2   Title Pt. will report 1/10 pain in R low back to promote return to daily exercise.    Baseline Pt. reports 3/10 R low back pain   Time 4   Period Weeks   Status New     PT LONG TERM GOAL #3   Title Pt. will demonstrate functional B hamstring length of 60 deg. to allow for proper body mechanics in all positions   Baseline B hamstring length ~50 deg.    Time 4   Period Weeks   Status New     PT LONG TERM GOAL #4   Title Pt. will be able to bend over and pick up object from floor with proper body mechanics and no radicular sx. in RLE to allow for return to yardwork/housework.   Baseline Pt. reports radicular pain down R LE while bending forward to pick up object from floor.    Time 4   Period Weeks           Plan - 05/24/16 1205    Clinical Impression Statement Pt. presented to PT clinic today with no LBP today and mild soreness after completing HEP yesterday. Pt. progressed through several core and LE strengthening exercises today focusing on utilizing quads and hamstrings to complete tasks. Pt. instructed on proper lifting technique by completeing a squat, keeping back straight and pushing through both LEs. Pt. required rest breaks due to tendency to hold breath during exercise but is able to maintain breath with mod. cuing. Pt. instructed to stay active over weekend and avoid forward lumbar flexion with any activities.    Rehab Potential Good   Clinical Impairments Affecting Rehab Potential improving sx, motivation, previous active lifestyle   PT Frequency 2x / week   PT Duration 4 weeks   PT Treatment/Interventions ADLs/Self Care Home Management;Cryotherapy;Moist Heat;Electrical Stimulation;Traction;Gait training;Therapeutic activities;Therapeutic exercise;Neuromuscular re-education;Balance training;Patient/family education;Manual techniques;Passive range of motion   PT Next Visit Plan Progress therex/manual tx.    PT Home Exercise Plan see handout   Consulted and Agree  with Plan of Care Patient      Patient will benefit from skilled therapeutic intervention in order to improve the following deficits and impairments:  Abnormal gait, Decreased balance, Decreased endurance, Decreased mobility, Hypomobility, Decreased range of motion, Improper body mechanics, Obesity, Decreased activity tolerance, Decreased strength, Impaired flexibility, Pain, Postural dysfunction  Visit Diagnosis: Right-sided low back pain with right-sided sciatica, unspecified chronicity  Muscle weakness (generalized)  Joint stiffness     Problem List Patient Active Problem List   Diagnosis Date Noted  . Controlled type 2 diabetes mellitus without complication ([place]) [id number]  . Neurilemmoma 10/07/2015  . History of colon polyps 10/07/2015  . Alimentary obesity 10/07/2015  . Combined fat and carbohydrate induced hyperlipemia 10/07/2015  . H/O: gout 10/07/2015  . ED (erectile dysfunction) of organic origin 10/07/2015  . Allergic rhinitis 10/07/2015  . Chronic kidney disease (CKD), stage III (moderate)  03/23/2015  . H/O meningitis 09/08/2006   Pura Spice, PT, DPT # [id number] Willodean Rosenthal, SPT 05/25/2016, 9:18 AM  [facility] [facility] [address] [place], [place], [address] Phone: [phone]   Fax:  [phone]  Name: Taylor Ross MRN: [id number] Date of Birth: 07-01-1945

## 2016-05-25 ENCOUNTER — Encounter: Payer: Self-pay | Admitting: Physical Therapy

## 2016-05-29 ENCOUNTER — Ambulatory Visit: Payer: PPO | Admitting: Physical Therapy

## 2016-05-29 DIAGNOSIS — M5441 Lumbago with sciatica, right side: Secondary | ICD-10-CM | POA: Diagnosis not present

## 2016-05-29 DIAGNOSIS — M2560 Stiffness of unspecified joint, not elsewhere classified: Secondary | ICD-10-CM

## 2016-05-29 DIAGNOSIS — M6281 Muscle weakness (generalized): Secondary | ICD-10-CM

## 2016-05-29 NOTE — Therapy (Signed)
[facility] [address] [place], [place], [address] Phone: [phone]   Fax:  [phone]  Physical Therapy Treatment  Patient Details  Name: Taylor Ross MRN: [id number] Date of Birth: Oct 17, 1945 Referring Provider: Meade Maw, MD  Encounter Date: 05/29/2016      PT End of Session - 05/29/16 0813    Visit Number 5   Number of Visits 8   Date for PT Re-Evaluation 06/12/16   Authorization - Visit Number 5   Authorization - Number of Visits 10   PT Start Time 0807   PT Stop Time [id number]   PT Time Calculation (min) 67 min   Activity Tolerance Patient tolerated treatment well;No increased pain   Behavior During Therapy WFL for tasks assessed/performed      Past Medical History:  Diagnosis Date  . Allergic rhinitis   . Bacterial meningitis   . Diabetes mellitus without complication ([place])   . GERD (gastroesophageal reflux disease)   . Gout   . Hyperlipidemia   . Hypertension   . Schwannoma     Past Surgical History:  Procedure Laterality Date  . BRAIN SURGERY  2006 x 3; 2010 x 1   Benign Tumor Removal [facility])  . COLON SURGERY  2008   Dr. Tamala Julian [facility])  . COLONOSCOPY WITH PROPOFOL N/A 11/08/2015   Procedure: COLONOSCOPY WITH PROPOFOL;  Surgeon: Lollie Sails, MD;  Location: [facility] ENDOSCOPY;  Service: Endoscopy;  Laterality: N/A;  . COLONOSCOPY WITH PROPOFOL N/A 11/09/2015   Procedure: COLONOSCOPY WITH PROPOFOL;  Surgeon: Lollie Sails, MD;  Location: [facility] ENDOSCOPY;  Service: Endoscopy;  Laterality: N/A;  . FLEXIBLE BRONCHOSCOPY N/A 10/04/2015   Procedure: FLEXIBLE BRONCHOSCOPY;  Surgeon: Wilhelmina Mcardle, MD;  Location: [facility] ORS;  Service: Pulmonary;  Laterality: N/A;    There were no vitals filed for this visit.      Subjective Assessment - 05/29/16 0810    Subjective Pt. arrived to PT clinic today with bandage on his head after hitting it on a door while bending down to pick something up from a  low shelf yesterday morning. Pt. did not need to visit ER to control bleeding. Pt. reports no pain but a little tenderness in anterior hip/groin.    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 The pain spread down his RLE with a burning/shooting sensation and was easily aggravated. Pt. has hx of brain/neurosurguries w/ no reported complications. Pt. was previously active with a walking program and would like to return to that once R low back/hip pain subsides. Pt. is scheduled for laminectomy 3/14 and would like to avoid having procedure done.    Limitations Sitting;Lifting;Standing;Walking;House hold activities   Patient Stated Goals Pt. would like to return to previous walking program (6x/week 2 mi/day)   Currently in Pain? No/denies       OBJECTIVE  There.Ex.: SciFit L4 10 min.  Functional body mechanics lifting: Lifting box from 18" surface with focus on straight back, bending knees, core tightness. 3x10 TG squats 3x10 with focus on proper knee alignment and core activation during entire exercise. Pt. tolerated exercise well and reports he can feel muscles working that he hasn't used in quite a while.  Step-ups 6" step and 12" focusing on body mechanics and posture    Manual tx: Supine LE/lumbar stretches (10 min.)- reassessment of muscle length.  Prone spinal mobility assessment (grade II-II  Mobs. To mid-thoracic/lumbar spine).  STM to paraspinals.  Moderate hypomobility.  No reproduction of symptoms. Long axis distraction 30 sec. Holds 3x R/L.Marland Kitchen Hip flexor stretch off table 30 sec. R/L   Pt. Tolerated tx. Well today with increasing ability to perform higher level therex with less fatigue. Pt. Has consistently decreased pain scores with only mild muscle tightness while at home. Pt. Will benefit from continued LE strengthening, stretching, and increased aerobic activity to promote pain-free functional mobility.            PT Education - 05/29/16 [phone]    Education provided Yes   Education Details see handout   Person(s) Educated Patient   Methods Explanation;Demonstration;Handout   Comprehension Verbalized understanding;Returned demonstration             PT Long Term Goals - 05/15/16 1338      PT LONG TERM GOAL #1   Title Pt. will decrease MODI score to <28% to improve self-perceived disability to allow for increased ADL function.    Baseline 05/16/15: 38%   Time 4   Period Weeks   Status New     PT LONG TERM GOAL #2   Title Pt. will report 1/10 pain in R low back to promote return to daily exercise.    Baseline Pt. reports 3/10 R low back pain   Time 4   Period Weeks   Status New     PT LONG TERM GOAL #3   Title Pt. will demonstrate functional B hamstring length of 60 deg. to allow for proper body mechanics in all positions   Baseline B hamstring length ~50 deg.    Time 4   Period Weeks   Status New     PT LONG TERM GOAL #4   Title Pt. will be able to bend over and pick up object from floor with proper body mechanics and no radicular sx. in RLE to allow for return to yardwork/housework.   Baseline Pt. reports radicular pain down R LE while bending forward to pick up object from floor.    Time 4   Period Weeks            Plan - 05/29/16 [id number]    Clinical Impression Statement Pt. presented to PT clinic today with no pain but tightness in R anterior groin. Pt. is demonstrating increased capacity for aerobic activities on the SciFit bicycle with good postural alignment duriing exercise. Pt. able to complete squat to lifting activities with strong core activation and less cuing to maintain proper body mechanics. Pt. introduced to [employer] machine today to increase LE strengthening while maintaining good postural alignment in a controlled manner. Pt. instructed on new home stretching techniques for hip flexors and hamstrings as well as standing LE strength exercises focusing  on core activation during exercise. Pt. still tender over L1-L3 region during prone palpation and grade II CPA's but hypomobility is slowly decreasing with more intervention.    Rehab Potential Good   Clinical Impairments Affecting Rehab Potential improving sx, motivation, previous active lifestyle   PT Frequency 2x / week   PT Duration 4 weeks   PT Treatment/Interventions ADLs/Self Care Home Management;Cryotherapy;Moist Heat;Electrical Stimulation;Traction;Gait training;Therapeutic activities;Therapeutic exercise;Neuromuscular re-education;Balance training;Patient/family education;Manual techniques;Passive range of motion   PT Next Visit Plan Progress therex/manual tx. Assess new HEP given 2/20   PT Home Exercise Plan see handout   Consulted and Agree with Plan of Care Patient      Patient will benefit from skilled therapeutic intervention in  order to improve the following deficits and impairments:  Abnormal gait, Decreased balance, Decreased endurance, Decreased mobility, Hypomobility, Decreased range of motion, Improper body mechanics, Obesity, Decreased activity tolerance, Decreased strength, Impaired flexibility, Pain, Postural dysfunction  Visit Diagnosis: Right-sided low back pain with right-sided sciatica, unspecified chronicity  Muscle weakness (generalized)  Joint stiffness     Problem List Patient Active Problem List   Diagnosis Date Noted  . Controlled type 2 diabetes mellitus without complication ([place]) [id number]  . Neurilemmoma 10/07/2015  . History of colon polyps 10/07/2015  . Alimentary obesity 10/07/2015  . Combined fat and carbohydrate induced hyperlipemia 10/07/2015  . H/O: gout 10/07/2015  . ED (erectile dysfunction) of organic origin 10/07/2015  . Allergic rhinitis 10/07/2015  . Chronic kidney disease (CKD), stage III (moderate) 03/23/2015  . H/O meningitis 09/08/2006   Pura Spice, PT, DPT # [id number] Willodean Rosenthal, SPT 05/30/2016, 7:27 AM  [facility] [address] [place], [place], [address] Phone: [phone]   Fax:  [phone]  Name: Taylor Ross MRN: [id number] Date of Birth: 02/19/1946

## 2016-05-31 ENCOUNTER — Ambulatory Visit: Payer: PPO | Admitting: Physical Therapy

## 2016-05-31 DIAGNOSIS — M5441 Lumbago with sciatica, right side: Secondary | ICD-10-CM | POA: Diagnosis not present

## 2016-05-31 DIAGNOSIS — M2560 Stiffness of unspecified joint, not elsewhere classified: Secondary | ICD-10-CM

## 2016-05-31 DIAGNOSIS — M5416 Radiculopathy, lumbar region: Secondary | ICD-10-CM | POA: Diagnosis not present

## 2016-05-31 DIAGNOSIS — M6281 Muscle weakness (generalized): Secondary | ICD-10-CM

## 2016-05-31 NOTE — Therapy (Signed)
[facility] [address] [place], [place], [address] Phone: [phone]   Fax:  [phone]  Physical Therapy Treatment  Patient Details  Name: Taylor Ross MRN: [id number] Date of Birth: [birth year] Referring Provider: Meade Maw, MD  Encounter Date: 05/31/2016      PT End of Session - 05/31/16 0850    Visit Number 6   Number of Visits 8   Date for PT Re-Evaluation 06/12/16   Authorization - Visit Number 6   Authorization - Number of Visits 10   PT Start Time 0809   PT Stop Time [id number]   PT Time Calculation (min) 50 min   Activity Tolerance Patient tolerated treatment well;No increased pain   Behavior During Therapy WFL for tasks assessed/performed      Past Medical History:  Diagnosis Date  . Allergic rhinitis   . Bacterial meningitis   . Diabetes mellitus without complication ([place])   . GERD (gastroesophageal reflux disease)   . Gout   . Hyperlipidemia   . Hypertension   . Schwannoma     Past Surgical History:  Procedure Laterality Date  . BRAIN SURGERY  2006 x 3; 2010 x 1   Benign Tumor Removal [facility])  . COLON SURGERY  2008   Dr. Tamala Julian [facility])  . COLONOSCOPY WITH PROPOFOL N/A 11/08/2015   Procedure: COLONOSCOPY WITH PROPOFOL;  Surgeon: Lollie Sails, MD;  Location: [facility] ENDOSCOPY;  Service: Endoscopy;  Laterality: N/A;  . COLONOSCOPY WITH PROPOFOL N/A 11/09/2015   Procedure: COLONOSCOPY WITH PROPOFOL;  Surgeon: Lollie Sails, MD;  Location: [facility] ENDOSCOPY;  Service: Endoscopy;  Laterality: N/A;  . FLEXIBLE BRONCHOSCOPY N/A 10/04/2015   Procedure: FLEXIBLE BRONCHOSCOPY;  Surgeon: Wilhelmina Mcardle, MD;  Location: [facility] ORS;  Service: Pulmonary;  Laterality: N/A;    There were no vitals filed for this visit.      Subjective Assessment - 05/31/16 0848    Subjective Patient reports seeing improvements with HEPs and will be seeing doctor today about surgery since unsure of what he wants at this  time.   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 The pain spread down his RLE with a burning/shooting sensation and was easily aggravated. Pt. has hx of brain/neurosurguries w/ no reported complications. Pt. was previously active with a walking program and would like to return to that once R low back/hip pain subsides. Pt. is scheduled for laminectomy 3/14 and would like to avoid having procedure done.    Limitations Sitting;Lifting;Standing;Walking;House hold activities   Patient Stated Goals Pt. would like to return to previous walking program (6x/week 2 mi/day)   Currently in Pain? No/denies      OBJECTIVE:  There.Ex.: SciFit L5 10 min (warm-up/no charge). TG squats 15x3 with proper knee alignment and activation of core during exercise. Ascend/descend stairs 4x forward, 4x side step L/R w/ light handrail assist. Standing hamstring and hip flexor stretching 30 sec. Static holds. Reviewed HEP. Ambulating in hallway in between exercises: pt. Walks with narrow BOS but demonstrates consistent step pattern and experiences no LOB   Neuro: standing balance progression on floor, repeated on airex pad: narrow base stance- 30 sec. Holds, no LOB. Tandem stance balance, 15 sec. Holds w/ no assist. Single leg balance R/L, light touch to railing for balance. Step-up to balance holds on 6" step R/L 10x each. Pt.'s balance limited by SLS but Is able to  self-correct any loss of balance safely.   Manual tx: supine passive stretching: B hamstrings, hip flexors, piriformis, lumbar rotn. 30 sec. Static holds 3x each R/L. Reviewed stretches from HEP and pt. Has had no issues at home with pain or discomfort so far. Pt.'s muscle length still limited overall but has shown improvement since initial evaluation.    Pt. Tolerated tx. Well today with increasing ability to perform higher level therex with less fatigue. Pt. Has consistently  decreased pain scores with only mild muscle tightness while at home. Pt. Works hard at home to maintain progress made in sessions. Pt. Will benefit from continued LE strengthening, stretching, and increased aerobic activity to promote pain-free functional mobility.           PT Long Term Goals - 05/15/16 1338      PT LONG TERM GOAL #1   Title Pt. will decrease MODI score to <28% to improve self-perceived disability to allow for increased ADL function.    Baseline 05/16/15: 38%   Time 4   Period Weeks   Status New     PT LONG TERM GOAL #2   Title Pt. will report 1/10 pain in R low back to promote return to daily exercise.    Baseline Pt. reports 3/10 R low back pain   Time 4   Period Weeks   Status New     PT LONG TERM GOAL #3   Title Pt. will demonstrate functional B hamstring length of 60 deg. to allow for proper body mechanics in all positions   Baseline B hamstring length ~50 deg.    Time 4   Period Weeks   Status New     PT LONG TERM GOAL #4   Title Pt. will be able to bend over and pick up object from floor with proper body mechanics and no radicular sx. in RLE to allow for return to yardwork/housework.   Baseline Pt. reports radicular pain down R LE while bending forward to pick up object from floor.    Time 4   Period Weeks            Plan - 05/31/16 [id number]    Clinical Impression Statement Pt. presented to PT clinic today with 0/10 pain but some soreness in R low back/hip and is still experiencing pain at night while rolling over in bed. Pt.  has demonstrated increased aerobic endurance and tolerance to physical activity with no increased pain during tx session. Pt. ambulating with decreased antalgic gait pattern and newer shoes providing more support than previous pair which allowed for over-pronation of both feet. Pt's overall functional mobility and pain have improved since initial evaluation but pt. is still limited from picking up objects off the floor and forward  flexion motions. Pt. has been compliant with HEP focusing on LE/core stabilization and stretching to improve muscle length and strength. Pt. will discuss scheduled surgery with Dr. later today.    Rehab Potential Good   Clinical Impairments Affecting Rehab Potential improving sx, motivation, previous active lifestyle   PT Frequency 2x / week   PT Duration 4 weeks   PT Treatment/Interventions ADLs/Self Care Home Management;Cryotherapy;Moist Heat;Electrical Stimulation;Traction;Gait training;Therapeutic activities;Therapeutic exercise;Neuromuscular re-education;Balance training;Patient/family education;Manual techniques;Passive range of motion   PT Next Visit Plan Progress therex/manual tx. Assess new HEP given 2/20   PT Home Exercise Plan see handout   Consulted and Agree with Plan of Care Patient      Patient will benefit from skilled therapeutic intervention in order  to improve the following deficits and impairments:  Abnormal gait, Decreased balance, Decreased endurance, Decreased mobility, Hypomobility, Decreased range of motion, Improper body mechanics, Obesity, Decreased activity tolerance, Decreased strength, Impaired flexibility, Pain, Postural dysfunction  Visit Diagnosis: Right-sided low back pain with right-sided sciatica, unspecified chronicity  Muscle weakness (generalized)  Joint stiffness     Problem List Patient Active Problem List   Diagnosis Date Noted  . Controlled type 2 diabetes mellitus without complication ([place]) [id number]  . Neurilemmoma 10/07/2015  . History of colon polyps 10/07/2015  . Alimentary obesity 10/07/2015  . Combined fat and carbohydrate induced hyperlipemia 10/07/2015  . H/O: gout 10/07/2015  . ED (erectile dysfunction) of organic origin 10/07/2015  . Allergic rhinitis 10/07/2015  . Chronic kidney disease (CKD), stage III (moderate) 03/23/2015  . H/O meningitis 09/08/2006   Pura Spice, PT, DPT # [id number] Willodean Rosenthal, SPT 05/31/2016,  10:11 AM  [facility] [address] [place], [place], [address] Phone: [phone]   Fax:  [phone]  Name: Taylor Ross MRN: [id number] Date of Birth: 07-Oct-1945

## 2016-06-05 ENCOUNTER — Ambulatory Visit: Payer: PPO | Admitting: Physical Therapy

## 2016-06-05 DIAGNOSIS — M5441 Lumbago with sciatica, right side: Secondary | ICD-10-CM

## 2016-06-05 DIAGNOSIS — M6281 Muscle weakness (generalized): Secondary | ICD-10-CM

## 2016-06-05 DIAGNOSIS — M2560 Stiffness of unspecified joint, not elsewhere classified: Secondary | ICD-10-CM

## 2016-06-05 NOTE — Therapy (Signed)
[facility] [address] [place], [place], [address] Phone: [phone]   Fax:  [phone]  Physical Therapy Treatment  Patient Details  Name: Taylor Ross MRN: [id number] Date of Birth: [birth year] Referring Provider: Meade Maw, MD  Encounter Date: 06/05/2016      PT End of Session - 06/05/16 1045    Visit Number 7   Number of Visits 8   Date for PT Re-Evaluation 06/12/16   Authorization - Visit Number 7   Authorization - Number of Visits 10   PT Start Time 0807   PT Stop Time 0901   PT Time Calculation (min) 54 min   Activity Tolerance Patient tolerated treatment well;No increased pain   Behavior During Therapy WFL for tasks assessed/performed      Past Medical History:  Diagnosis Date  . Allergic rhinitis   . Bacterial meningitis   . Diabetes mellitus without complication ([place])   . GERD (gastroesophageal reflux disease)   . Gout   . Hyperlipidemia   . Hypertension   . Schwannoma     Past Surgical History:  Procedure Laterality Date  . BRAIN SURGERY  2006 x 3; 2010 x 1   Benign Tumor Removal [facility])  . COLON SURGERY  2008   Dr. Tamala Julian [facility])  . COLONOSCOPY WITH PROPOFOL N/A 11/08/2015   Procedure: COLONOSCOPY WITH PROPOFOL;  Surgeon: Lollie Sails, MD;  Location: [facility] ENDOSCOPY;  Service: Endoscopy;  Laterality: N/A;  . COLONOSCOPY WITH PROPOFOL N/A 11/09/2015   Procedure: COLONOSCOPY WITH PROPOFOL;  Surgeon: Lollie Sails, MD;  Location: [facility] ENDOSCOPY;  Service: Endoscopy;  Laterality: N/A;  . FLEXIBLE BRONCHOSCOPY N/A 10/04/2015   Procedure: FLEXIBLE BRONCHOSCOPY;  Surgeon: Wilhelmina Mcardle, MD;  Location: [facility] ORS;  Service: Pulmonary;  Laterality: N/A;    There were no vitals filed for this visit.      Subjective Assessment - 06/05/16 0809    Subjective Pt met with doctor and decided to push the surgery back about 2 weeks. Patient has been compliant with HEP. He gardened Friday  and felt good afterwards.    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 The pain spread down his RLE with a burning/shooting sensation and was easily aggravated. Pt. has hx of brain/neurosurguries w/ no reported complications. Pt. was previously active with a walking program and would like to return to that once R low back/hip pain subsides. Pt. is scheduled for laminectomy 3/14 and would like to avoid having procedure done.    Limitations Sitting;Lifting;Standing;Walking;House hold activities   Patient Stated Goals Pt. would like to return to previous walking program (6x/week 2 mi/day)   Currently in Pain? No/denies      There Ex  SciFit L5 10 min (warm-up/no charge).  Wall squats 2x10. Tactile cues for body mechanics and body positioningin  Side step ups 6" 10x each leg Stair stretch 5x each leg. Hamstring and hip flexor stretch hold 30 second each position.  Step-up to balance holds on 6" step R/L 10x each. Pt.'s balance limited by SLS but Is able to self-correct any loss of balance safely.  Ambulating between interventions to assess carryover   Manual tx: supine passive stretching: B hamstrings, hip flexors, piriformis, lumbar rotn. 2x 60 sec. Static holds 3x each R/L. Grade I-III UPA's and CPA's mobilizations to thoracolumbar spine 3x10 seconds each level. Lumbar tender    Pt. Tolerated  tx. Well today with increasing ability to perform higher level therex with less fatigue. Pt. Will benefit from continued LE strengthening, stretching, and increased aerobic activity to promote pain-free functional mobility.          PT Long Term Goals - 05/15/16 1338      PT LONG TERM GOAL #1   Title Pt. will decrease MODI score to <28% to improve self-perceived disability to allow for increased ADL function.    Baseline 05/16/15: 38%   Time 4   Period Weeks   Status New     PT LONG TERM GOAL #2   Title Pt. will  report 1/10 pain in R low back to promote return to daily exercise.    Baseline Pt. reports 3/10 R low back pain   Time 4   Period Weeks   Status New     PT LONG TERM GOAL #3   Title Pt. will demonstrate functional B hamstring length of 60 deg. to allow for proper body mechanics in all positions   Baseline B hamstring length ~50 deg.    Time 4   Period Weeks   Status New     PT LONG TERM GOAL #4   Title Pt. will be able to bend over and pick up object from floor with proper body mechanics and no radicular sx. in RLE to allow for return to yardwork/housework.   Baseline Pt. reports radicular pain down R LE while bending forward to pick up object from floor.    Time 4   Period Weeks            Plan - 06/05/16 1046    Clinical Impression Statement Patient presents to PT clinic today having met with doctor and decided to push surgery back a few weeks due to recent improvements. Patient continues to have occasional R hip/low back pain at night/waking up. Patient had decreased antalgic gait post manual.  Grade I-III CPA UPAs to lower lumbar are hypomobile and tender with decreased tenderness than prior sessions. Muscle length is improving from stretches in clinic and in HEP. Patient continues to be compliant with HEP and demonstrates understanding of body mechanics. Pt will continue to benefit from skilled physical therapy intervnetion to improve strength, range of motion, control pain, and improve quality of life.    Rehab Potential Good   Clinical Impairments Affecting Rehab Potential improving sx, motivation, previous active lifestyle   PT Frequency 2x / week   PT Duration 4 weeks   PT Treatment/Interventions ADLs/Self Care Home Management;Cryotherapy;Moist Heat;Electrical Stimulation;Traction;Gait training;Therapeutic activities;Therapeutic exercise;Neuromuscular re-education;Balance training;Patient/family education;Manual techniques;Passive range of motion   PT Next Visit Plan decide  1-2x/week. New exercises-1 stretch, 2 strength HEP   PT Home Exercise Plan see handout   Consulted and Agree with Plan of Care Patient      Patient will benefit from skilled therapeutic intervention in order to improve the following deficits and impairments:  Abnormal gait, Decreased balance, Decreased endurance, Decreased mobility, Hypomobility, Decreased range of motion, Improper body mechanics, Obesity, Decreased activity tolerance, Decreased strength, Impaired flexibility, Pain, Postural dysfunction  Visit Diagnosis: Right-sided low back pain with right-sided sciatica, unspecified chronicity  Muscle weakness (generalized)  Joint stiffness     Problem List Patient Active Problem List   Diagnosis Date Noted  . Controlled type 2 diabetes mellitus without complication ([place]) 67/61/9509  . Neurilemmoma 10/07/2015  . History of colon polyps 10/07/2015  . Alimentary obesity 10/07/2015  . Combined fat and carbohydrate induced hyperlipemia 10/07/2015  .  H/O: gout 10/07/2015  . ED (erectile dysfunction) of organic origin 10/07/2015  . Allergic rhinitis 10/07/2015  . Chronic kidney disease (CKD), stage III (moderate) 03/23/2015  . H/O meningitis 09/08/2006   Pura Spice, PT, DPT # [id number] Janna Arch, SPT 06/05/2016, 4:45 PM  [facility] [address] [place], [place], [address] Phone: [phone]   Fax:  [phone]  Name: CAPONE SCHWINN MRN: [id number] Date of Birth: [birth year]

## 2016-06-07 ENCOUNTER — Ambulatory Visit: Payer: PPO | Attending: Neurosurgery | Admitting: Physical Therapy

## 2016-06-07 DIAGNOSIS — M2560 Stiffness of unspecified joint, not elsewhere classified: Secondary | ICD-10-CM | POA: Insufficient documentation

## 2016-06-07 DIAGNOSIS — M6281 Muscle weakness (generalized): Secondary | ICD-10-CM | POA: Diagnosis not present

## 2016-06-07 DIAGNOSIS — M5441 Lumbago with sciatica, right side: Secondary | ICD-10-CM

## 2016-06-07 NOTE — Therapy (Signed)
[facility] [address] [place], [place], [address] Phone: [phone]   Fax:  [phone]  Physical Therapy Treatment  Patient Details  Name: Taylor Ross MRN: [id number] Date of Birth: 1945-10-01 Referring Provider: Meade Maw, MD  Encounter Date: 06/07/2016      PT End of Session - 06/07/16 1221    Visit Number 8   Number of Visits 8   Date for PT Re-Evaluation 06/12/16   Authorization - Visit Number 8   Authorization - Number of Visits 10   PT Start Time 0807   PT Stop Time 0902   PT Time Calculation (min) 55 min   Activity Tolerance Patient tolerated treatment well;No increased pain   Behavior During Therapy WFL for tasks assessed/performed      Past Medical History:  Diagnosis Date  . Allergic rhinitis   . Bacterial meningitis   . Diabetes mellitus without complication ([place])   . GERD (gastroesophageal reflux disease)   . Gout   . Hyperlipidemia   . Hypertension   . Schwannoma     Past Surgical History:  Procedure Laterality Date  . BRAIN SURGERY  2006 x 3; 2010 x 1   Benign Tumor Removal [facility])  . COLON SURGERY  2008   Dr. Tamala Julian [facility])  . COLONOSCOPY WITH PROPOFOL N/A 11/08/2015   Procedure: COLONOSCOPY WITH PROPOFOL;  Surgeon: Lollie Sails, MD;  Location: [facility] ENDOSCOPY;  Service: Endoscopy;  Laterality: N/A;  . COLONOSCOPY WITH PROPOFOL N/A 11/09/2015   Procedure: COLONOSCOPY WITH PROPOFOL;  Surgeon: Lollie Sails, MD;  Location: [facility] ENDOSCOPY;  Service: Endoscopy;  Laterality: N/A;  . FLEXIBLE BRONCHOSCOPY N/A 10/04/2015   Procedure: FLEXIBLE BRONCHOSCOPY;  Surgeon: Wilhelmina Mcardle, MD;  Location: [facility] ORS;  Service: Pulmonary;  Laterality: N/A;    There were no vitals filed for this visit.      Subjective Assessment - 06/07/16 0811    Subjective Patient has been having a little more pain recently in hip due to the weather. Pt. went to the dentist about his teeth since last  visit.    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 The pain spread down his RLE with a burning/shooting sensation and was easily aggravated. Pt. has hx of brain/neurosurguries w/ no reported complications. Pt. was previously active with a walking program and would like to return to that once R low back/hip pain subsides. Pt. is scheduled for laminectomy 3/14 and would like to avoid having procedure done.    Limitations Sitting;Lifting;Standing;Walking;House hold activities   Patient Stated Goals Pt. would like to return to previous walking program (6x/week 2 mi/day)   Pain Score 3    Pain Location Back   Pain Orientation Right;Lower   Pain Descriptors / Indicators Discomfort      There Ex  SciFit L5 10 min (warm-up/no charge).  Wall squats 2x10. Tactile cues for body mechanics and body positioning   TA contraction 5x5 TA contraction marching 1x30 seconds  Stair stretch 5x each leg. Hamstring and hip flexor stretch hold 30 second each position.  Back extensions 5x Lunges in // bars 4x length of // bars.  Standing hamstring quad stretch 30 seconds each position.  Ambulating between interventions to assess carryover   Manual tx: supine passive stretching: B hamstrings, hip flexors, piriformis, lumbar rotn. 2x 60 sec. Static holds 3x each R/L. Grade I-III UPA's and CPA's  mobilizations to thoracolumbar spine 3x10 seconds each level. L5 tender. Box transfer to practice stepping not twisting for carryover to pigs. Picking up 5lb box 5x     Pt. Tolerated tx. Well today with increasing ability to perform higher level therex with less fatigue. Pt. Will benefit from continued LE strengthening, stretching, and increased aerobic activity to promote pain-free functional mobility.          PT Long Term Goals - 05/15/16 1338      PT LONG TERM GOAL #1   Title Pt. will decrease MODI score to <28% to improve  self-perceived disability to allow for increased ADL function.    Baseline 05/16/15: 38%   Time 4   Period Weeks   Status New     PT LONG TERM GOAL #2   Title Pt. will report 1/10 pain in R low back to promote return to daily exercise.    Baseline Pt. reports 3/10 R low back pain   Time 4   Period Weeks   Status New     PT LONG TERM GOAL #3   Title Pt. will demonstrate functional B hamstring length of 60 deg. to allow for proper body mechanics in all positions   Baseline B hamstring length ~50 deg.    Time 4   Period Weeks   Status New     PT LONG TERM GOAL #4   Title Pt. will be able to bend over and pick up object from floor with proper body mechanics and no radicular sx. in RLE to allow for return to yardwork/housework.   Baseline Pt. reports radicular pain down R LE while bending forward to pick up object from floor.    Time 4   Period Weeks               Plan - 06/07/16 1229    Clinical Impression Statement Patient presents to PT clinic today with some stiffness due to inclement weather.  Pt. continues to be tender to CPA's to lumbar region but no longer UPA's demonstrating an improved mobility of lumbar spine. Muscle length continues to be a challenge to patient with tight hamstrings and hip flexors noted and pt was educated on importance of stretching in the morning and before bed. New HEP was given with a focus on strengthening and stretching. Pt. was educated on proper lifting and turning techniques to avoid thoracolumbar rotation. Pt. will benefit from continued skilled physical therapy intervention to improve strength, range of motion, control pain, and improve quality of life.    Rehab Potential Good   Clinical Impairments Affecting Rehab Potential improving sx, motivation, previous active lifestyle   PT Frequency 2x / week   PT Duration 4 weeks   PT Treatment/Interventions ADLs/Self Care Home Management;Cryotherapy;Moist Heat;Electrical Stimulation;Traction;Gait  training;Therapeutic activities;Therapeutic exercise;Neuromuscular re-education;Balance training;Patient/family education;Manual techniques;Passive range of motion   PT Next Visit Plan review HEP.  RECERT next tx.   PT Home Exercise Plan see handout   Consulted and Agree with Plan of Care Patient      Patient will benefit from skilled therapeutic intervention in order to improve the following deficits and impairments:  Abnormal gait, Decreased balance, Decreased endurance, Decreased mobility, Hypomobility, Decreased range of motion, Improper body mechanics, Obesity, Decreased activity tolerance, Decreased strength, Impaired flexibility, Pain, Postural dysfunction  Visit Diagnosis: Right-sided low back pain with right-sided sciatica, unspecified chronicity  Muscle weakness (generalized)  Joint stiffness     Problem List Patient Active Problem List   Diagnosis Date Noted  .  Controlled type 2 diabetes mellitus without complication ([place]) [id number]  . Neurilemmoma 10/07/2015  . History of colon polyps 10/07/2015  . Alimentary obesity 10/07/2015  . Combined fat and carbohydrate induced hyperlipemia 10/07/2015  . H/O: gout 10/07/2015  . ED (erectile dysfunction) of organic origin 10/07/2015  . Allergic rhinitis 10/07/2015  . Chronic kidney disease (CKD), stage III (moderate) 03/23/2015  . H/O meningitis 09/08/2006   Pura Spice, PT, DPT # [id number] Janna Arch, SPT 06/08/2016, 2:32 PM   [facility] [address] [place], [place], [address] Phone: [phone]   Fax:  [phone]  Name: Taylor Ross MRN: [id number] Date of Birth: Jun 20, 1945

## 2016-06-12 ENCOUNTER — Ambulatory Visit: Payer: PPO | Admitting: Physical Therapy

## 2016-06-12 DIAGNOSIS — M5441 Lumbago with sciatica, right side: Secondary | ICD-10-CM | POA: Diagnosis not present

## 2016-06-12 DIAGNOSIS — M2560 Stiffness of unspecified joint, not elsewhere classified: Secondary | ICD-10-CM

## 2016-06-12 DIAGNOSIS — M6281 Muscle weakness (generalized): Secondary | ICD-10-CM

## 2016-06-12 NOTE — Addendum Note (Signed)
Addended by: Dorcas Carrow C on: 06/12/2016 02:09 PM   Modules accepted: Orders

## 2016-06-12 NOTE — Therapy (Addendum)
[facility] [facility] [address] [place], [place], [address] Phone: [phone]   Fax:  [phone]  Physical Therapy Treatment  Patient Details  Name: Taylor Ross MRN: [id number] Date of Birth: [birth year] Referring Provider: Meade Maw, MD  Encounter Date: 06/12/2016      PT End of Session - 06/12/16 0809    Visit Number 9   Number of Visits 16   Date for PT Re-Evaluation 07/10/16   Authorization - Visit Number 9   Authorization - Number of Visits 18   PT Start Time 0805   PT Stop Time 0906   PT Time Calculation (min) 61 min   Activity Tolerance Patient tolerated treatment well;No increased pain   Behavior During Therapy WFL for tasks assessed/performed      Past Medical History:  Diagnosis Date  . Allergic rhinitis   . Bacterial meningitis   . Diabetes mellitus without complication ([place])   . GERD (gastroesophageal reflux disease)   . Gout   . Hyperlipidemia   . Hypertension   . Schwannoma     Past Surgical History:  Procedure Laterality Date  . BRAIN SURGERY  2006 x 3; 2010 x 1   Benign Tumor Removal [facility])  . COLON SURGERY  2008   Dr. Tamala Julian [facility])  . COLONOSCOPY WITH PROPOFOL N/A 11/08/2015   Procedure: COLONOSCOPY WITH PROPOFOL;  Surgeon: Lollie Sails, MD;  Location: [facility] ENDOSCOPY;  Service: Endoscopy;  Laterality: N/A;  . COLONOSCOPY WITH PROPOFOL N/A 11/09/2015   Procedure: COLONOSCOPY WITH PROPOFOL;  Surgeon: Lollie Sails, MD;  Location: [facility] ENDOSCOPY;  Service: Endoscopy;  Laterality: N/A;  . FLEXIBLE BRONCHOSCOPY N/A 10/04/2015   Procedure: FLEXIBLE BRONCHOSCOPY;  Surgeon: Wilhelmina Mcardle, MD;  Location: [facility] ORS;  Service: Pulmonary;  Laterality: N/A;    There were no vitals filed for this visit.      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 Could only drive 2 hours at a time before having to get out of car  to walk to relieve hip.    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 The pain spread down his RLE with a burning/shooting sensation and was easily aggravated. Pt. has hx of brain/neurosurguries w/ no reported complications. Pt. was previously active with a walking program and would like to return to that once R low back/hip pain subsides. Pt. is scheduled for laminectomy 3/14 and would like to avoid having procedure done.    Limitations Sitting;Lifting;Standing;Walking;House hold activities   Patient Stated Goals Pt. would like to return to previous walking program (6x/week 2 mi/day)   Currently in Pain? No/denies     Therex: Pushing weighted sleigh in hallway: 350 ft with turns 40lb on sleigh, 300 ft with turns 50lb on sleigh. Focus on upright posture with abdominal control when pushing with side stepping not twisting during turns.  Picking up box from different height surfaces with 20f walk and return to surface. 10lb box from 16 inch surface 2x, 10lb box from 8inch surface x2, box from ground 2x with 3rd trial with 5lb.  SciFit 10 minutes L6 (no charge) Wall squats 1x10 Stair hamstring stretch 1x30 seconds each leg Standing back extensions 1x10 cues for duration of extension Supine hip flexor stretch 1x30 seconds each leg  Manual  supine passive stretching: B hamstrings 2x  60 sec, hip flexors, piriformis, popliteal angle, IT band 1x 30sec.     Pt. Tolerated tx. Well today with increasing ability to perform higher level therex with less fatigue. Pt. Will benefit from continued LE strengthening, stretching, and increased aerobic activity to promote pain-free functional mobility.          PT Long Term Goals - 06/12/16 1235      PT LONG TERM GOAL #1   Title Pt. will decrease MODI score to <28% to improve self-perceived disability to allow for increased ADL function.    Baseline 3/6: 30% 05/16/15: 38%    Time 4   Period Weeks   Status Partially Met     PT LONG TERM GOAL #2   Title Pt. will report 1/10 pain in R low back to promote return to daily exercise.    Baseline 3/ 6: no pain into clinic, occasional pain throughout week   Time 4   Period Weeks   Status Achieved     PT LONG TERM GOAL #3   Title Pt. will demonstrate functional B hamstring length of 60 deg. to allow for proper body mechanics in all positions   Baseline 3/6: R: 53, L 58   Time 4   Period Weeks   Status Partially Met     PT LONG TERM GOAL #4   Title Pt. will be able to bend over and pick up object from floor with proper body mechanics and no radicular sx. in RLE to allow for return to yardwork/housework.   Baseline 3/6 patient can pick up light objects off of ground and return to standing with proper body mechanics.    Time 4   Period Weeks   Status Achieved     PT LONG TERM GOAL #5   Title Pt. will pick up 20lb box from ground with proper body mechanics and no radicular symptoms to be able to return to normal activities of daily living.    Baseline 3:6. Pt. can pick up 5lb box at this time.    Time 4   Period Weeks   Status New     Additional Long Term Goals   Additional Long Term Goals Yes     PT LONG TERM GOAL #6   Title Pt. will ambulate 300 ft with normal gait mechanics and no radicular symptoms.    Baseline Pt. occasionally has radicular symptoms and antalgic gait.    Time 4   Period Weeks   Status New            Plan - 06/12/16 1235    Clinical Impression Statement Pt. presented to therapy with improved mobility and decreased pain. Body mechanics with functional activities such as lifting objects from ground and pushing weighted sled are improving with less frequent cueing required for abdominal contraction and upright posture. Hamstring flexibility bilaterally continues to be limited with R:53 deg, L 58 deg. Improved quality of life is reported with less frequent pain but continues to have  some limitations such as vacuuming and picking up heavier objects. Pt. will benefit from more advanced return to living body mechanic interventions to decrease patient's limitation in daily life. Pt. scored a 30% on MODI placing patient in perceived moderate disability, but with an 8% improvement. Pt. will benefit from continued skilled physical therapy to improve strength, ROM, control pain, and improve quality of life.    Rehab Potential Good   Clinical Impairments Affecting Rehab Potential improving sx, motivation, previous active lifestyle  PT Frequency 2x / week   PT Duration 4 weeks   PT Treatment/Interventions ADLs/Self Care Home Management;Cryotherapy;Moist Heat;Electrical Stimulation;Traction;Gait training;Therapeutic activities;Therapeutic exercise;Neuromuscular re-education;Balance training;Patient/family education;Manual techniques;Passive range of motion   PT Next Visit Plan lifting heavier items from floor, cleaning    PT Home Exercise Plan see handout   Consulted and Agree with Plan of Care Patient      Patient will benefit from skilled therapeutic intervention in order to improve the following deficits and impairments:  Abnormal gait, Decreased balance, Decreased endurance, Decreased mobility, Hypomobility, Decreased range of motion, Improper body mechanics, Obesity, Decreased activity tolerance, Decreased strength, Impaired flexibility, Pain, Postural dysfunction  Visit Diagnosis: Right-sided low back pain with right-sided sciatica, unspecified chronicity  Muscle weakness (generalized)  Joint stiffness       G-Codes - 06-Jul-2016 1406    Functional Assessment Tool Used (Outpatient Only) Clinical impression/ MODI/ pain/ joint stiffness   Functional Limitation Mobility: Walking and moving around   Mobility: Walking and Moving Around Current Status (Y5207) At least 20 percent but less than 40 percent impaired, limited or restricted   Mobility: Walking and Moving Around Goal  Status (O1915) At least 1 percent but less than 20 percent impaired, limited or restricted      Problem List Patient Active Problem List   Diagnosis Date Noted  . Controlled type 2 diabetes mellitus without complication ([place]) 50/27/1423  . Neurilemmoma 10/07/2015  . History of colon polyps 10/07/2015  . Alimentary obesity 10/07/2015  . Combined fat and carbohydrate induced hyperlipemia 10/07/2015  . H/O: gout 10/07/2015  . ED (erectile dysfunction) of organic origin 10/07/2015  . Allergic rhinitis 10/07/2015  . Chronic kidney disease (CKD), stage III (moderate) 03/23/2015  . H/O meningitis 09/08/2006   Pura Spice, PT, DPT # [id number] Janna Arch, SPT 07-06-2016, 2:07 PM  [place] [facility] [address] [place], [place], [address] Phone: [phone]   Fax:  [phone]  Name: Taylor Ross MRN: [id number] Date of Birth: 11-Jun-1945

## 2016-06-13 ENCOUNTER — Other Ambulatory Visit: Payer: PPO

## 2016-06-14 ENCOUNTER — Ambulatory Visit: Payer: PPO | Admitting: Physical Therapy

## 2016-06-14 ENCOUNTER — Encounter: Payer: Self-pay | Admitting: Physical Therapy

## 2016-06-14 DIAGNOSIS — M5441 Lumbago with sciatica, right side: Secondary | ICD-10-CM | POA: Diagnosis not present

## 2016-06-14 DIAGNOSIS — M2560 Stiffness of unspecified joint, not elsewhere classified: Secondary | ICD-10-CM

## 2016-06-14 DIAGNOSIS — M6281 Muscle weakness (generalized): Secondary | ICD-10-CM

## 2016-06-14 NOTE — Therapy (Signed)
[place] [facility] [address] [place], [place], [address] Phone: [phone]   Fax:  [phone]  Physical Therapy Treatment  Patient Details  Name: Taylor Ross MRN: [id number] Date of Birth: 1945-04-23 Referring Provider: Meade Maw, MD  Encounter Date: 06/14/2016      PT End of Session - 06/14/16 0817    Visit Number 10   Number of Visits 16   Date for PT Re-Evaluation 07/10/16   Authorization - Visit Number 10   Authorization - Number of Visits 18   PT Start Time 0813   PT Stop Time 0904   PT Time Calculation (min) 51 min   Activity Tolerance Patient tolerated treatment well;No increased pain   Behavior During Therapy WFL for tasks assessed/performed      Past Medical History:  Diagnosis Date  . Allergic rhinitis   . Bacterial meningitis   . Diabetes mellitus without complication ([place])   . GERD (gastroesophageal reflux disease)   . Gout   . Hyperlipidemia   . Hypertension   . Schwannoma     Past Surgical History:  Procedure Laterality Date  . BRAIN SURGERY  2006 x 3; 2010 x 1   Benign Tumor Removal [facility])  . COLON SURGERY  2008   Dr. Tamala Julian [facility])  . COLONOSCOPY WITH PROPOFOL N/A 11/08/2015   Procedure: COLONOSCOPY WITH PROPOFOL;  Surgeon: Lollie Sails, MD;  Location[facility] ENDOSCOPY;  Service: Endoscopy;  Laterality: N/A;  . COLONOSCOPY WITH PROPOFOL N/A 11/09/2015   Procedure: COLONOSCOPY WITH PROPOFOL;  Surgeon: Lollie Sails, MD;  Location: [facility] ENDOSCOPY;  Service: Endoscopy;  Laterality: N/A;  . FLEXIBLE BRONCHOSCOPY N/A 10/04/2015   Procedure: FLEXIBLE BRONCHOSCOPY;  Surgeon: Wilhelmina Mcardle, MD;  Location: [facility] ORS;  Service: Pulmonary;  Laterality: N/A;    There were no vitals filed for this visit.      Subjective Assessment - 06/14/16 0816    Subjective Patient has been feeling good, woke up with some hip pain. Reports compliance with HEP and is willing to shift to a more home  based focus and check in in two weeks.    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 The pain spread down his RLE with a burning/shooting sensation and was easily aggravated. Pt. has hx of brain/neurosurguries w/ no reported complications. Pt. was previously active with a walking program and would like to return to that once R low back/hip pain subsides. Pt. is scheduled for laminectomy 3/14 and would like to avoid having procedure done.    Limitations Sitting;Lifting;Standing;Walking;House hold activities   Patient Stated Goals Pt. would like to return to previous walking program (6x/week 2 mi/day)   Currently in Pain? No/denies     Nustep Lvl6-8 10 min (no charge)  Sled 40 lb 4x Lifting heavier boxes from floor, 10lb box + 5 lb, + 15 lb. 4x each weight Gastroc/hamstring stretch 2x 60 sec IT band standing stretch 2x30 seconds.  Marching Hamstring curls , cause cramping with right.  Side stepping in // bars with cues for keeping both feet facing forward.x6 Side step onto 6" step 10x each leg. Cues for keeping foot facing forward  NeuroRe-ed: Airex pad: standing x60 seconds, weight shifting x 60 seconds, slow marching x 60 seconds Crane step up 10x each leg on 6" step.   Manual  supine passive stretching: B hamstrings 2x 60 sec, hip flexors,  piriformis, popliteal angle, IT band 1x 30sec. Neural glides/flossing RLE in supine 1x40 seconds.          PT Long Term Goals - 06/12/16 1235      PT LONG TERM GOAL #1   Title Pt. will decrease MODI score to <28% to improve self-perceived disability to allow for increased ADL function.    Baseline 3/6: 30% 05/16/15: 38%   Time 4   Period Weeks   Status Partially Met     PT LONG TERM GOAL #2   Title Pt. will report 1/10 pain in R low back to promote return to daily exercise.    Baseline 3/ 6: no pain into clinic, occasional pain throughout week   Time 4    Period Weeks   Status Achieved     PT LONG TERM GOAL #3   Title Pt. will demonstrate functional B hamstring length of 60 deg. to allow for proper body mechanics in all positions   Baseline 3/6: R: 53, L 58   Time 4   Period Weeks   Status Partially Met     PT LONG TERM GOAL #4   Title Pt. will be able to bend over and pick up object from floor with proper body mechanics and no radicular sx. in RLE to allow for return to yardwork/housework.   Baseline 3/6 patient can pick up light objects off of ground and return to standing with proper body mechanics.    Time 4   Period Weeks   Status Achieved     PT LONG TERM GOAL #5   Title Pt. will pick up 20lb box from ground with proper body mechanics and no radicular symptoms to be able to return to normal activities of daily living.    Baseline 3:6. Pt. can pick up 5lb box at this time.    Time 4   Period Weeks   Status New     Additional Long Term Goals   Additional Long Term Goals Yes     PT LONG TERM GOAL #6   Title Pt. will ambulate 300 ft with normal gait mechanics and no radicular symptoms.    Baseline Pt. occasionally has radicular symptoms and antalgic gait.    Time 4   Period Weeks   Status New            Plan - 06/14/16 1240    Clinical Impression Statement Patient presents to physical therapy with improved functional mobility. Lifting weighted box from ground was performed with good body mechanics to a weight total of 25 lbs for multiple trials. Pt. maintained good core control while pushing weighted sleigh simulating lawnmower. Hamstring flexibility bilaterally continues to be limited with improved popliteal angle on L. Pt. reports improved quality of life and is starting to return to prior activities. Will start implementing walking in daily life again. A shift towards a more independent home program was discussed with patient agreeable. Patient will return in two weeks to assess progress and alter HEP and plan of care as  needed.     Rehab Potential Good   Clinical Impairments Affecting Rehab Potential improving sx, motivation, previous active lifestyle   PT Frequency 2x / week   PT Duration 4 weeks   PT Treatment/Interventions ADLs/Self Care Home Management;Cryotherapy;Moist Heat;Electrical Stimulation;Traction;Gait training;Therapeutic activities;Therapeutic exercise;Neuromuscular re-education;Balance training;Patient/family education;Manual techniques;Passive range of motion   PT Next Visit Plan review HEP and reassess POC   PT Home Exercise Plan see handout   Consulted and Agree with  Plan of Care Patient      Patient will benefit from skilled therapeutic intervention in order to improve the following deficits and impairments:  Abnormal gait, Decreased balance, Decreased endurance, Decreased mobility, Hypomobility, Decreased range of motion, Improper body mechanics, Obesity, Decreased activity tolerance, Decreased strength, Impaired flexibility, Pain, Postural dysfunction  Visit Diagnosis: Right-sided low back pain with right-sided sciatica, unspecified chronicity  Muscle weakness (generalized)  Joint stiffness     Problem List Patient Active Problem List   Diagnosis Date Noted  . Controlled type 2 diabetes mellitus without complication ([place]) 67/59/1638  . Neurilemmoma 10/07/2015  . History of colon polyps 10/07/2015  . Alimentary obesity 10/07/2015  . Combined fat and carbohydrate induced hyperlipemia 10/07/2015  . H/O: gout 10/07/2015  . ED (erectile dysfunction) of organic origin 10/07/2015  . Allergic rhinitis 10/07/2015  . Chronic kidney disease (CKD), stage III (moderate) 03/23/2015  . H/O meningitis 09/08/2006   Pura Spice, PT, DPT # [id number] Janna Arch, SPT 06/14/2016, 1:23 PM  [place] [facility] [address] [place], [place], [address] Phone: [phone]   Fax:  [phone]  Name: WILBERN PENNYPACKER MRN: [id number] Date of Birth:  03-01-1946

## 2016-06-27 ENCOUNTER — Other Ambulatory Visit: Payer: PPO

## 2016-07-04 ENCOUNTER — Ambulatory Visit: Admission: RE | Admit: 2016-07-04 | Payer: PPO | Source: Ambulatory Visit

## 2016-07-04 ENCOUNTER — Encounter: Admission: RE | Payer: Self-pay | Source: Ambulatory Visit

## 2016-07-04 SURGERY — LUMBAR LAMINECTOMY/DECOMPRESSION MICRODISCECTOMY 2 LEVELS
Anesthesia: Choice | Laterality: Right

## 2016-07-18 DIAGNOSIS — E119 Type 2 diabetes mellitus without complications: Secondary | ICD-10-CM | POA: Diagnosis not present

## 2016-07-18 DIAGNOSIS — E782 Mixed hyperlipidemia: Secondary | ICD-10-CM | POA: Diagnosis not present

## 2016-07-18 DIAGNOSIS — N183 Chronic kidney disease, stage 3 (moderate): Secondary | ICD-10-CM | POA: Diagnosis not present

## 2016-07-25 DIAGNOSIS — E6609 Other obesity due to excess calories: Secondary | ICD-10-CM | POA: Diagnosis not present

## 2016-07-25 DIAGNOSIS — E782 Mixed hyperlipidemia: Secondary | ICD-10-CM | POA: Diagnosis not present

## 2016-07-25 DIAGNOSIS — H401130 Primary open-angle glaucoma, bilateral, stage unspecified: Secondary | ICD-10-CM | POA: Diagnosis not present

## 2016-07-25 DIAGNOSIS — E119 Type 2 diabetes mellitus without complications: Secondary | ICD-10-CM | POA: Diagnosis not present

## 2016-07-25 DIAGNOSIS — N183 Chronic kidney disease, stage 3 (moderate): Secondary | ICD-10-CM | POA: Diagnosis not present

## 2016-07-25 DIAGNOSIS — I10 Essential (primary) hypertension: Secondary | ICD-10-CM | POA: Diagnosis not present

## 2016-09-04 DIAGNOSIS — E1142 Type 2 diabetes mellitus with diabetic polyneuropathy: Secondary | ICD-10-CM | POA: Diagnosis not present

## 2016-09-04 DIAGNOSIS — L03031 Cellulitis of right toe: Secondary | ICD-10-CM | POA: Diagnosis not present

## 2016-09-18 DIAGNOSIS — L03031 Cellulitis of right toe: Secondary | ICD-10-CM | POA: Diagnosis not present

## 2016-10-01 IMAGING — CT CT CHEST W/O CM
1 series · 15 of 34 positions shown, 19 images · non-contrast
Comparison: None.

CLINICAL DATA: Dry cough, short of breath.  RIGHT chest discomfort.

EXAM:
CT CHEST WITHOUT CONTRAST
TECHNIQUE: Multidetector CT imaging of the chest was performed following the
standard protocol without IV contrast.

[Series 2: thorax · axial · 0.83mm/px · z∈[-545,-271]mm · 15 of 161 slices shown, 19 images]
[im 12/161  mediastinal]
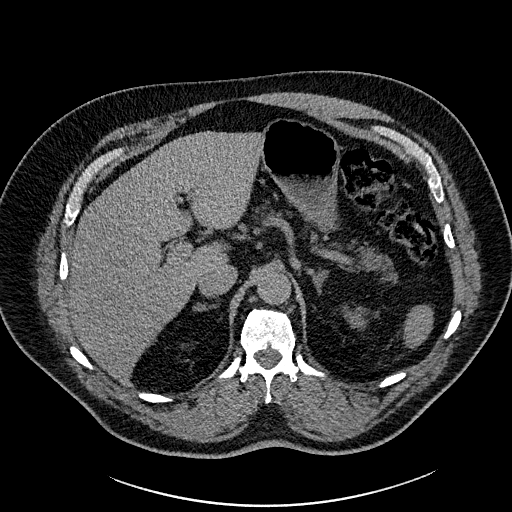
[im 12/161  lung]
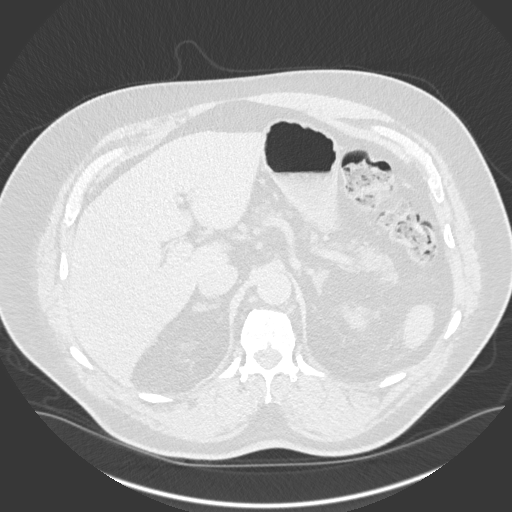
[im 24/161  lung]
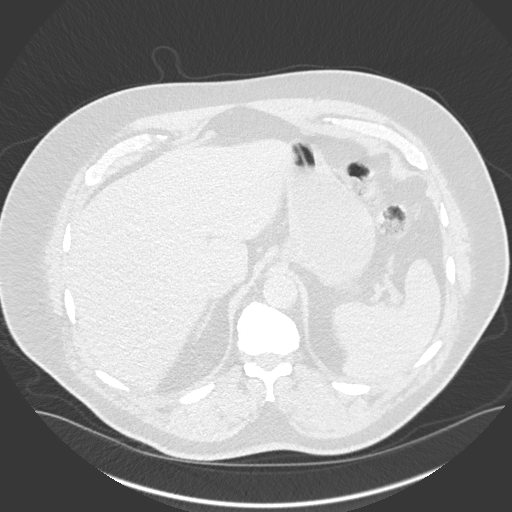
[im 33/161  lung]
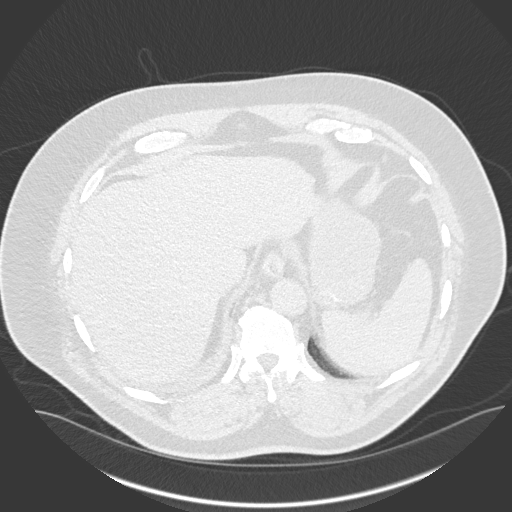
[im 42/161  lung]
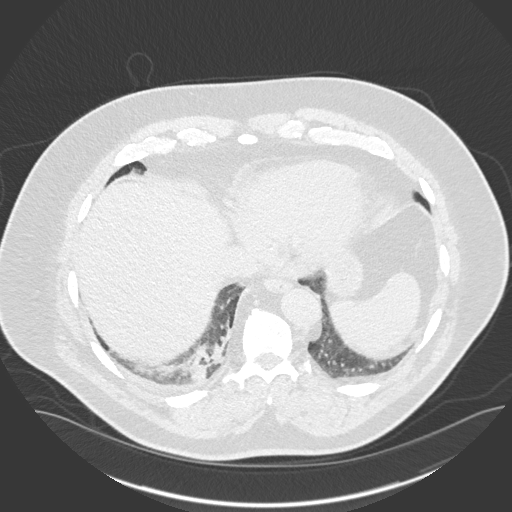
[im 54/161  mediastinal]
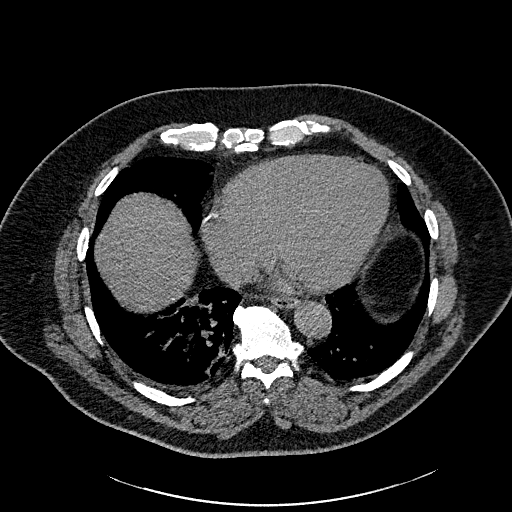
[im 54/161  lung]
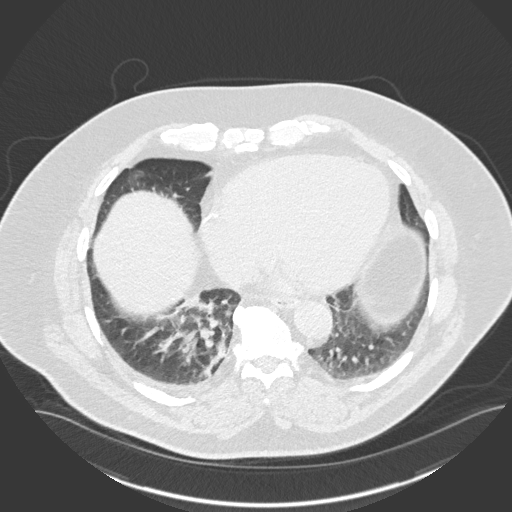
[im 65/161  lung]
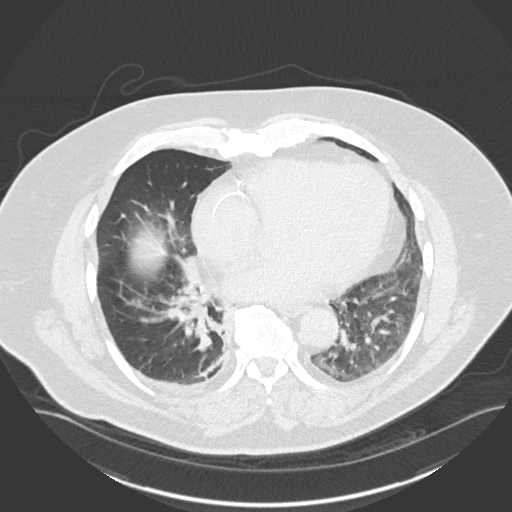
[im 72/161  lung]
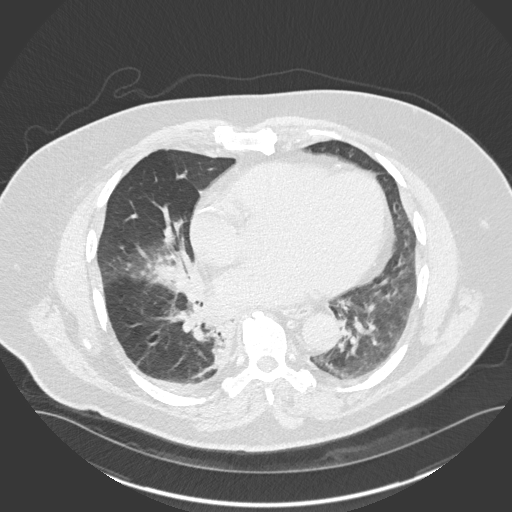
[im 83/161  lung]
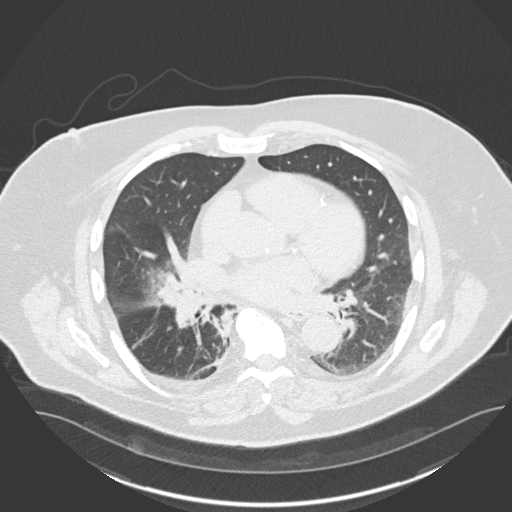
[im 89/161  mediastinal]
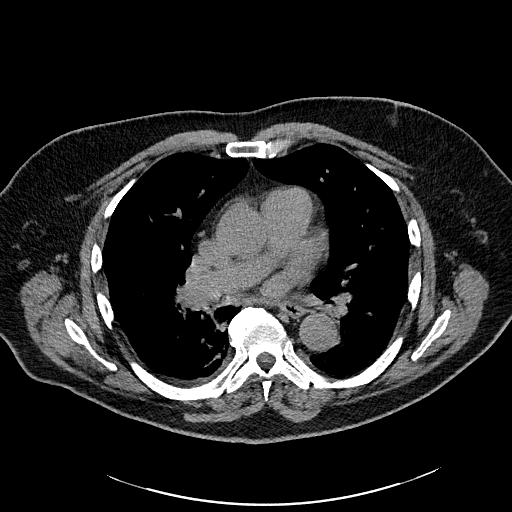
[im 89/161  lung]
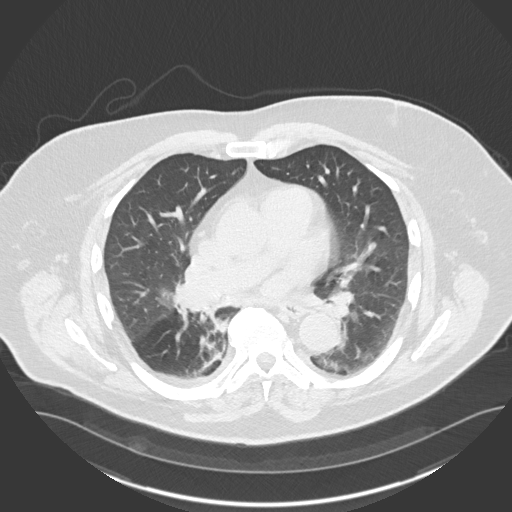
[im 97/161  lung]
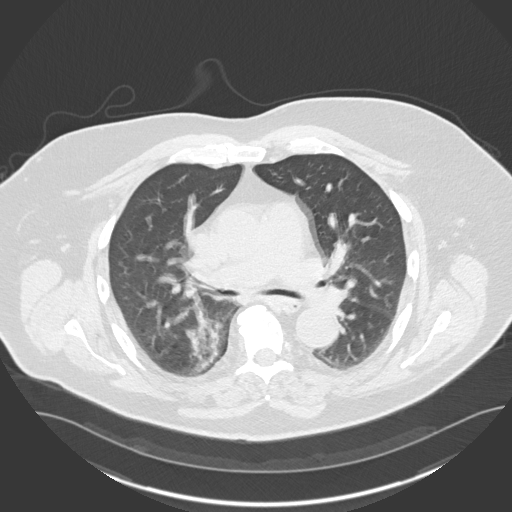
[im 107/161  lung]
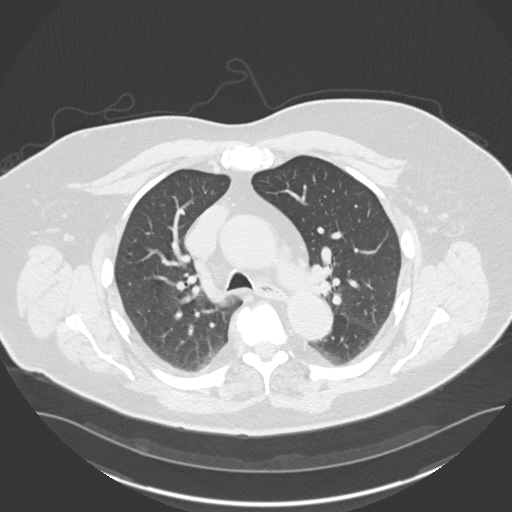
[im 119/161  lung]
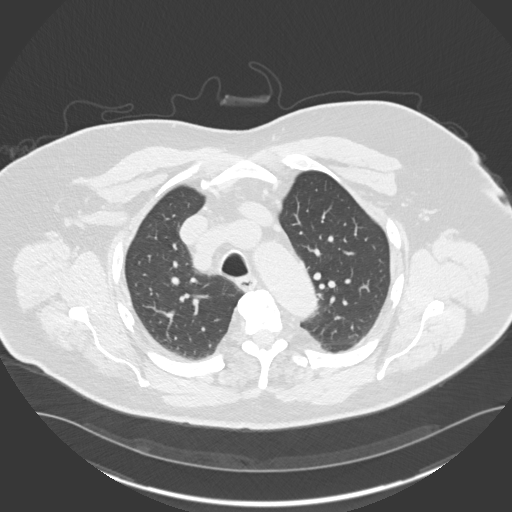
[im 129/161  mediastinal]
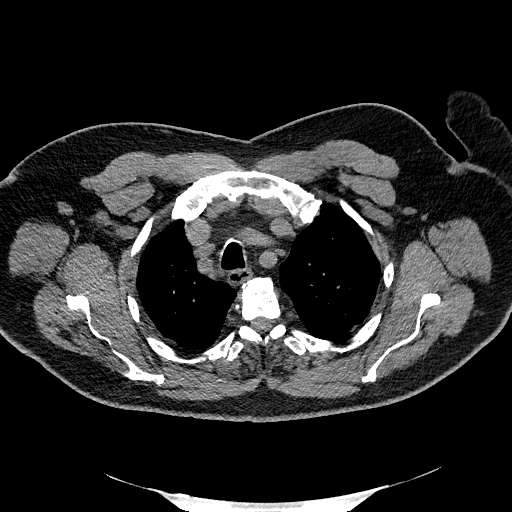
[im 129/161  lung]
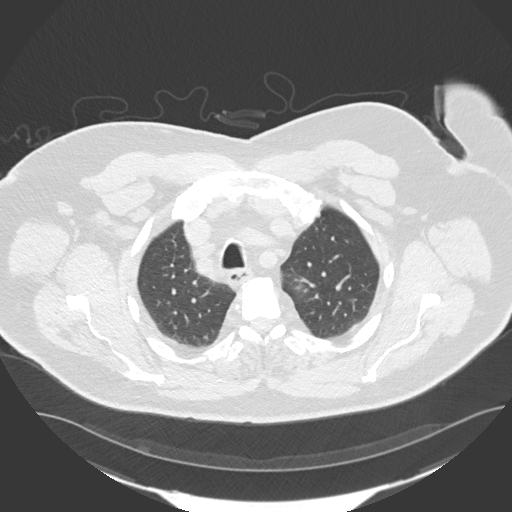
[im 137/161  lung]
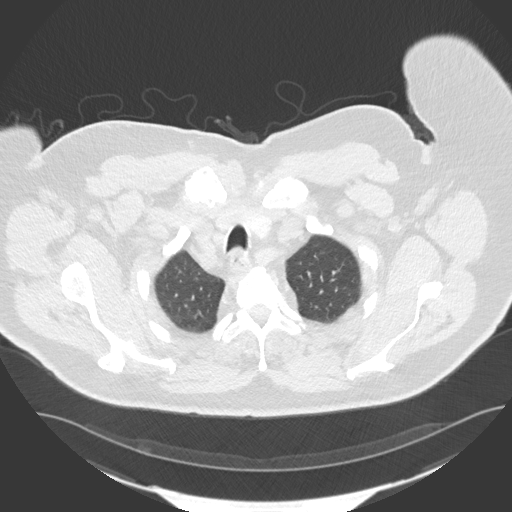
[im 149/161  lung]
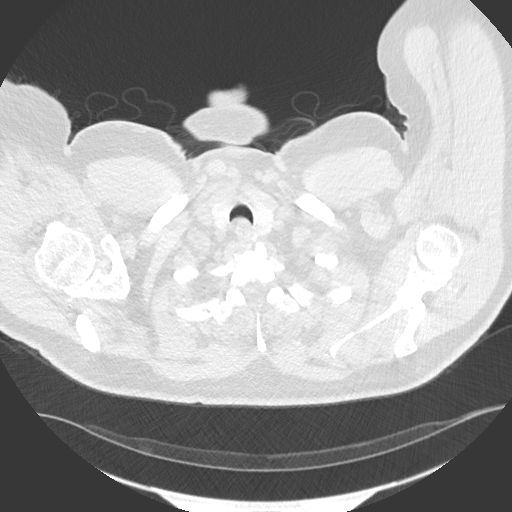

[15 of 34 positions shown; findings below may reference images not displayed]

FINDINGS: Mediastinum/Nodes: No axillary or supraclavicular adenopathy. No
mediastinal hilar lymphadenopathy. No pericardial fluid. Normal
esophagus.

Lungs/Pleura: RIGHT perihilar peribronchovascular thickening and
nodular airspace disease involving the central RIGHT middle lobe,
RIGHT upper lobe and RIGHT lower lobe. No discrete measurable
nodularity. LEFT lung is relatively clear.

Upper abdomen: Limited view of the liver, kidneys, pancreas are
unremarkable. Normal adrenal glands. Chronic calcifications in the
mid pancreas. Fatty replacement towards the head of the pancreas.
Entire pancreas not imaged

Musculoskeletal: Bulky osteophytosis of the thoracic spine per
IMPRESSION: 1. Central peribronchovascular thickening and nodular consolidation
surrounding the RIGHT hilum involving the RIGHT middle lobe
predominantly but also the RIGHT upper lobe and RIGHT lower lobe.
Findings are most suggestive of an infectious process. Cannot
exclude granulomatous disease such as atypical pulmonary sarcoidosis
or neoplastic process such as lymphoma and less likely bronchogenic
carcinoma. Recommend appropriate antibiotic therapy if patient has
signs symptoms of respiratory infection. Regardless, recommend
follow-up CT thorax with contrast and in 4 to 6 weeks.

2. Partially imaged pancreas has findings suggestive of chronic
pancreatitis.

These results will be called to the ordering clinician or
representative by the Radiologist Assistant, and communication
documented in the PACS or zVision Dashboard.

## 2016-12-06 DIAGNOSIS — I10 Essential (primary) hypertension: Secondary | ICD-10-CM | POA: Diagnosis not present

## 2016-12-06 DIAGNOSIS — N183 Chronic kidney disease, stage 3 (moderate): Secondary | ICD-10-CM | POA: Diagnosis not present

## 2016-12-06 DIAGNOSIS — E119 Type 2 diabetes mellitus without complications: Secondary | ICD-10-CM | POA: Diagnosis not present

## 2016-12-06 DIAGNOSIS — E782 Mixed hyperlipidemia: Secondary | ICD-10-CM | POA: Diagnosis not present

## 2016-12-12 DIAGNOSIS — N289 Disorder of kidney and ureter, unspecified: Secondary | ICD-10-CM | POA: Diagnosis not present

## 2016-12-13 DIAGNOSIS — E119 Type 2 diabetes mellitus without complications: Secondary | ICD-10-CM | POA: Diagnosis not present

## 2016-12-13 DIAGNOSIS — N183 Chronic kidney disease, stage 3 (moderate): Secondary | ICD-10-CM | POA: Diagnosis not present

## 2016-12-13 DIAGNOSIS — E782 Mixed hyperlipidemia: Secondary | ICD-10-CM | POA: Diagnosis not present

## 2016-12-13 DIAGNOSIS — I10 Essential (primary) hypertension: Secondary | ICD-10-CM | POA: Diagnosis not present

## 2016-12-13 DIAGNOSIS — E6609 Other obesity due to excess calories: Secondary | ICD-10-CM | POA: Diagnosis not present

## 2017-04-22 DIAGNOSIS — H1131 Conjunctival hemorrhage, right eye: Secondary | ICD-10-CM | POA: Diagnosis not present

## 2017-04-24 DIAGNOSIS — I10 Essential (primary) hypertension: Secondary | ICD-10-CM | POA: Diagnosis not present

## 2017-04-24 DIAGNOSIS — E119 Type 2 diabetes mellitus without complications: Secondary | ICD-10-CM | POA: Diagnosis not present

## 2017-04-24 DIAGNOSIS — E782 Mixed hyperlipidemia: Secondary | ICD-10-CM | POA: Diagnosis not present

## 2017-04-24 DIAGNOSIS — N183 Chronic kidney disease, stage 3 (moderate): Secondary | ICD-10-CM | POA: Diagnosis not present

## 2017-05-29 DIAGNOSIS — E6609 Other obesity due to excess calories: Secondary | ICD-10-CM | POA: Diagnosis not present

## 2017-05-29 DIAGNOSIS — E782 Mixed hyperlipidemia: Secondary | ICD-10-CM | POA: Diagnosis not present

## 2017-05-29 DIAGNOSIS — I10 Essential (primary) hypertension: Secondary | ICD-10-CM | POA: Diagnosis not present

## 2017-05-29 DIAGNOSIS — E119 Type 2 diabetes mellitus without complications: Secondary | ICD-10-CM | POA: Diagnosis not present

## 2017-05-29 DIAGNOSIS — Z0000 Encounter for general adult medical examination without abnormal findings: Secondary | ICD-10-CM | POA: Diagnosis not present

## 2017-07-24 DIAGNOSIS — E119 Type 2 diabetes mellitus without complications: Secondary | ICD-10-CM | POA: Diagnosis not present

## 2017-07-24 DIAGNOSIS — H401131 Primary open-angle glaucoma, bilateral, mild stage: Secondary | ICD-10-CM | POA: Diagnosis not present

## 2017-10-01 DIAGNOSIS — M79674 Pain in right toe(s): Secondary | ICD-10-CM | POA: Diagnosis not present

## 2017-10-01 DIAGNOSIS — M79671 Pain in right foot: Secondary | ICD-10-CM | POA: Diagnosis not present

## 2017-10-01 DIAGNOSIS — E119 Type 2 diabetes mellitus without complications: Secondary | ICD-10-CM | POA: Diagnosis not present

## 2017-10-01 DIAGNOSIS — I10 Essential (primary) hypertension: Secondary | ICD-10-CM | POA: Diagnosis not present

## 2017-10-01 DIAGNOSIS — E782 Mixed hyperlipidemia: Secondary | ICD-10-CM | POA: Diagnosis not present

## 2017-10-08 DIAGNOSIS — L03818 Cellulitis of other sites: Secondary | ICD-10-CM | POA: Diagnosis not present

## 2017-10-08 DIAGNOSIS — E782 Mixed hyperlipidemia: Secondary | ICD-10-CM | POA: Diagnosis not present

## 2017-10-08 DIAGNOSIS — N183 Chronic kidney disease, stage 3 (moderate): Secondary | ICD-10-CM | POA: Diagnosis not present

## 2017-10-08 DIAGNOSIS — I10 Essential (primary) hypertension: Secondary | ICD-10-CM | POA: Diagnosis not present

## 2017-10-08 DIAGNOSIS — Z8739 Personal history of other diseases of the musculoskeletal system and connective tissue: Secondary | ICD-10-CM | POA: Diagnosis not present

## 2017-10-08 DIAGNOSIS — E1122 Type 2 diabetes mellitus with diabetic chronic kidney disease: Secondary | ICD-10-CM | POA: Diagnosis not present

## 2017-12-24 DIAGNOSIS — S92331A Displaced fracture of third metatarsal bone, right foot, initial encounter for closed fracture: Secondary | ICD-10-CM | POA: Diagnosis not present

## 2017-12-24 DIAGNOSIS — L97409 Non-pressure chronic ulcer of unspecified heel and midfoot with unspecified severity: Secondary | ICD-10-CM | POA: Diagnosis not present

## 2017-12-24 DIAGNOSIS — S92321A Displaced fracture of second metatarsal bone, right foot, initial encounter for closed fracture: Secondary | ICD-10-CM | POA: Diagnosis not present

## 2017-12-24 DIAGNOSIS — E1142 Type 2 diabetes mellitus with diabetic polyneuropathy: Secondary | ICD-10-CM | POA: Diagnosis not present

## 2017-12-24 DIAGNOSIS — M79671 Pain in right foot: Secondary | ICD-10-CM | POA: Diagnosis not present

## 2017-12-24 DIAGNOSIS — E11610 Type 2 diabetes mellitus with diabetic neuropathic arthropathy: Secondary | ICD-10-CM | POA: Diagnosis not present

## 2018-01-02 DIAGNOSIS — E1140 Type 2 diabetes mellitus with diabetic neuropathy, unspecified: Secondary | ICD-10-CM | POA: Diagnosis not present

## 2018-01-02 DIAGNOSIS — I10 Essential (primary) hypertension: Secondary | ICD-10-CM | POA: Diagnosis not present

## 2018-01-06 DIAGNOSIS — L97409 Non-pressure chronic ulcer of unspecified heel and midfoot with unspecified severity: Secondary | ICD-10-CM | POA: Diagnosis not present

## 2018-01-06 DIAGNOSIS — E1142 Type 2 diabetes mellitus with diabetic polyneuropathy: Secondary | ICD-10-CM | POA: Diagnosis not present

## 2018-01-14 DIAGNOSIS — E11610 Type 2 diabetes mellitus with diabetic neuropathic arthropathy: Secondary | ICD-10-CM | POA: Diagnosis not present

## 2018-01-14 DIAGNOSIS — S92321D Displaced fracture of second metatarsal bone, right foot, subsequent encounter for fracture with routine healing: Secondary | ICD-10-CM | POA: Diagnosis not present

## 2018-01-14 DIAGNOSIS — M79672 Pain in left foot: Secondary | ICD-10-CM | POA: Diagnosis not present

## 2018-02-11 DIAGNOSIS — E11610 Type 2 diabetes mellitus with diabetic neuropathic arthropathy: Secondary | ICD-10-CM | POA: Diagnosis not present

## 2018-02-11 DIAGNOSIS — L97511 Non-pressure chronic ulcer of other part of right foot limited to breakdown of skin: Secondary | ICD-10-CM | POA: Diagnosis not present

## 2018-02-20 DIAGNOSIS — I10 Essential (primary) hypertension: Secondary | ICD-10-CM | POA: Diagnosis not present

## 2018-02-20 DIAGNOSIS — E1122 Type 2 diabetes mellitus with diabetic chronic kidney disease: Secondary | ICD-10-CM | POA: Diagnosis not present

## 2018-02-20 DIAGNOSIS — N183 Chronic kidney disease, stage 3 (moderate): Secondary | ICD-10-CM | POA: Diagnosis not present

## 2018-02-20 DIAGNOSIS — E782 Mixed hyperlipidemia: Secondary | ICD-10-CM | POA: Diagnosis not present

## 2018-02-21 DIAGNOSIS — E1122 Type 2 diabetes mellitus with diabetic chronic kidney disease: Secondary | ICD-10-CM | POA: Diagnosis not present

## 2018-02-21 DIAGNOSIS — I10 Essential (primary) hypertension: Secondary | ICD-10-CM | POA: Diagnosis not present

## 2018-02-21 DIAGNOSIS — N183 Chronic kidney disease, stage 3 (moderate): Secondary | ICD-10-CM | POA: Diagnosis not present

## 2018-02-21 DIAGNOSIS — E782 Mixed hyperlipidemia: Secondary | ICD-10-CM | POA: Diagnosis not present

## 2018-03-13 DIAGNOSIS — J0100 Acute maxillary sinusitis, unspecified: Secondary | ICD-10-CM | POA: Diagnosis not present

## 2018-03-25 DIAGNOSIS — E11610 Type 2 diabetes mellitus with diabetic neuropathic arthropathy: Secondary | ICD-10-CM | POA: Diagnosis not present

## 2018-03-25 DIAGNOSIS — B351 Tinea unguium: Secondary | ICD-10-CM | POA: Diagnosis not present

## 2018-03-25 DIAGNOSIS — E1142 Type 2 diabetes mellitus with diabetic polyneuropathy: Secondary | ICD-10-CM | POA: Diagnosis not present

## 2018-03-25 DIAGNOSIS — L97511 Non-pressure chronic ulcer of other part of right foot limited to breakdown of skin: Secondary | ICD-10-CM | POA: Diagnosis not present

## 2018-05-22 DIAGNOSIS — L97511 Non-pressure chronic ulcer of other part of right foot limited to breakdown of skin: Secondary | ICD-10-CM | POA: Diagnosis not present

## 2018-05-22 DIAGNOSIS — E11610 Type 2 diabetes mellitus with diabetic neuropathic arthropathy: Secondary | ICD-10-CM | POA: Diagnosis not present

## 2018-05-22 DIAGNOSIS — E1142 Type 2 diabetes mellitus with diabetic polyneuropathy: Secondary | ICD-10-CM | POA: Diagnosis not present

## 2018-05-26 DIAGNOSIS — E11621 Type 2 diabetes mellitus with foot ulcer: Secondary | ICD-10-CM | POA: Diagnosis not present

## 2018-05-26 DIAGNOSIS — L97511 Non-pressure chronic ulcer of other part of right foot limited to breakdown of skin: Secondary | ICD-10-CM | POA: Diagnosis not present

## 2018-05-26 DIAGNOSIS — N183 Chronic kidney disease, stage 3 (moderate): Secondary | ICD-10-CM | POA: Diagnosis not present

## 2018-05-26 DIAGNOSIS — E1122 Type 2 diabetes mellitus with diabetic chronic kidney disease: Secondary | ICD-10-CM | POA: Diagnosis not present

## 2018-05-26 DIAGNOSIS — E1140 Type 2 diabetes mellitus with diabetic neuropathy, unspecified: Secondary | ICD-10-CM | POA: Diagnosis not present

## 2018-06-19 DIAGNOSIS — S90221D Contusion of right lesser toe(s) with damage to nail, subsequent encounter: Secondary | ICD-10-CM | POA: Diagnosis not present

## 2018-06-19 DIAGNOSIS — E1142 Type 2 diabetes mellitus with diabetic polyneuropathy: Secondary | ICD-10-CM | POA: Diagnosis not present

## 2018-08-19 DIAGNOSIS — I10 Essential (primary) hypertension: Secondary | ICD-10-CM | POA: Diagnosis not present

## 2018-08-19 DIAGNOSIS — E782 Mixed hyperlipidemia: Secondary | ICD-10-CM | POA: Diagnosis not present

## 2018-08-19 DIAGNOSIS — E1140 Type 2 diabetes mellitus with diabetic neuropathy, unspecified: Secondary | ICD-10-CM | POA: Diagnosis not present

## 2018-08-26 DIAGNOSIS — Z0000 Encounter for general adult medical examination without abnormal findings: Secondary | ICD-10-CM | POA: Diagnosis not present

## 2018-08-26 DIAGNOSIS — E1140 Type 2 diabetes mellitus with diabetic neuropathy, unspecified: Secondary | ICD-10-CM | POA: Diagnosis not present

## 2018-08-26 DIAGNOSIS — I10 Essential (primary) hypertension: Secondary | ICD-10-CM | POA: Diagnosis not present

## 2018-08-26 DIAGNOSIS — E782 Mixed hyperlipidemia: Secondary | ICD-10-CM | POA: Diagnosis not present

## 2018-09-23 DIAGNOSIS — E11610 Type 2 diabetes mellitus with diabetic neuropathic arthropathy: Secondary | ICD-10-CM | POA: Diagnosis not present

## 2018-09-23 DIAGNOSIS — L97511 Non-pressure chronic ulcer of other part of right foot limited to breakdown of skin: Secondary | ICD-10-CM | POA: Diagnosis not present

## 2018-09-23 DIAGNOSIS — E1142 Type 2 diabetes mellitus with diabetic polyneuropathy: Secondary | ICD-10-CM | POA: Diagnosis not present

## 2018-09-25 DIAGNOSIS — Z86010 Personal history of colonic polyps: Secondary | ICD-10-CM | POA: Diagnosis not present

## 2018-10-14 DIAGNOSIS — E1142 Type 2 diabetes mellitus with diabetic polyneuropathy: Secondary | ICD-10-CM | POA: Diagnosis not present

## 2018-10-14 DIAGNOSIS — L97511 Non-pressure chronic ulcer of other part of right foot limited to breakdown of skin: Secondary | ICD-10-CM | POA: Diagnosis not present

## 2018-10-14 DIAGNOSIS — B351 Tinea unguium: Secondary | ICD-10-CM | POA: Diagnosis not present

## 2018-10-14 DIAGNOSIS — E11610 Type 2 diabetes mellitus with diabetic neuropathic arthropathy: Secondary | ICD-10-CM | POA: Diagnosis not present

## 2018-10-17 ENCOUNTER — Other Ambulatory Visit: Payer: Self-pay

## 2018-10-17 ENCOUNTER — Other Ambulatory Visit
Admission: RE | Admit: 2018-10-17 | Discharge: 2018-10-17 | Disposition: A | Discharge status: Home / Self Care | Payer: PPO | Source: Ambulatory Visit | Attending: Gastroenterology | Admitting: Gastroenterology

## 2018-10-17 DIAGNOSIS — Z1159 Encounter for screening for other viral diseases: Secondary | ICD-10-CM | POA: Diagnosis not present

## 2018-10-17 DIAGNOSIS — Z01812 Encounter for preprocedural laboratory examination: Secondary | ICD-10-CM | POA: Diagnosis not present

## 2018-10-18 LAB — SARS CORONAVIRUS 2 (TAT 6-24 HRS): SARS Coronavirus 2: NEGATIVE

## 2018-10-20 ENCOUNTER — Encounter: Payer: Self-pay | Admitting: *Deleted

## 2018-10-21 ENCOUNTER — Ambulatory Visit: Payer: PPO | Admitting: Anesthesiology

## 2018-10-21 ENCOUNTER — Encounter
Admission: RE | Disposition: A | Discharge status: Home / Self Care | Payer: Self-pay | Source: Home / Self Care | Attending: Gastroenterology

## 2018-10-21 ENCOUNTER — Ambulatory Visit
Admission: RE | Admit: 2018-10-21 | Discharge: 2018-10-21 | Disposition: A | Discharge status: Home / Self Care | Payer: PPO | Attending: Gastroenterology | Admitting: Gastroenterology

## 2018-10-21 ENCOUNTER — Encounter: Payer: Self-pay | Admitting: Anesthesiology

## 2018-10-21 ENCOUNTER — Other Ambulatory Visit: Payer: Self-pay

## 2018-10-21 DIAGNOSIS — Z6838 Body mass index (BMI) 38.0-38.9, adult: Secondary | ICD-10-CM | POA: Insufficient documentation

## 2018-10-21 DIAGNOSIS — Z1211 Encounter for screening for malignant neoplasm of colon: Secondary | ICD-10-CM | POA: Insufficient documentation

## 2018-10-21 DIAGNOSIS — D120 Benign neoplasm of cecum: Secondary | ICD-10-CM | POA: Insufficient documentation

## 2018-10-21 DIAGNOSIS — Z880 Allergy status to penicillin: Secondary | ICD-10-CM | POA: Diagnosis not present

## 2018-10-21 DIAGNOSIS — E119 Type 2 diabetes mellitus without complications: Secondary | ICD-10-CM | POA: Insufficient documentation

## 2018-10-21 DIAGNOSIS — G4730 Sleep apnea, unspecified: Secondary | ICD-10-CM | POA: Insufficient documentation

## 2018-10-21 DIAGNOSIS — K640 First degree hemorrhoids: Secondary | ICD-10-CM | POA: Diagnosis not present

## 2018-10-21 DIAGNOSIS — Z86010 Personal history of colonic polyps: Secondary | ICD-10-CM | POA: Diagnosis not present

## 2018-10-21 DIAGNOSIS — Z888 Allergy status to other drugs, medicaments and biological substances status: Secondary | ICD-10-CM | POA: Insufficient documentation

## 2018-10-21 DIAGNOSIS — Z980 Intestinal bypass and anastomosis status: Secondary | ICD-10-CM | POA: Diagnosis not present

## 2018-10-21 DIAGNOSIS — Z79899 Other long term (current) drug therapy: Secondary | ICD-10-CM | POA: Diagnosis not present

## 2018-10-21 DIAGNOSIS — M109 Gout, unspecified: Secondary | ICD-10-CM | POA: Diagnosis not present

## 2018-10-21 DIAGNOSIS — I10 Essential (primary) hypertension: Secondary | ICD-10-CM | POA: Diagnosis not present

## 2018-10-21 DIAGNOSIS — J45909 Unspecified asthma, uncomplicated: Secondary | ICD-10-CM | POA: Insufficient documentation

## 2018-10-21 DIAGNOSIS — K219 Gastro-esophageal reflux disease without esophagitis: Secondary | ICD-10-CM | POA: Insufficient documentation

## 2018-10-21 DIAGNOSIS — D123 Benign neoplasm of transverse colon: Secondary | ICD-10-CM | POA: Diagnosis not present

## 2018-10-21 DIAGNOSIS — K5790 Diverticulosis of intestine, part unspecified, without perforation or abscess without bleeding: Secondary | ICD-10-CM | POA: Diagnosis not present

## 2018-10-21 DIAGNOSIS — K6389 Other specified diseases of intestine: Secondary | ICD-10-CM | POA: Diagnosis not present

## 2018-10-21 DIAGNOSIS — I129 Hypertensive chronic kidney disease with stage 1 through stage 4 chronic kidney disease, or unspecified chronic kidney disease: Secondary | ICD-10-CM | POA: Diagnosis not present

## 2018-10-21 DIAGNOSIS — Z7982 Long term (current) use of aspirin: Secondary | ICD-10-CM | POA: Insufficient documentation

## 2018-10-21 DIAGNOSIS — E1122 Type 2 diabetes mellitus with diabetic chronic kidney disease: Secondary | ICD-10-CM | POA: Diagnosis not present

## 2018-10-21 DIAGNOSIS — K644 Residual hemorrhoidal skin tags: Secondary | ICD-10-CM | POA: Diagnosis not present

## 2018-10-21 DIAGNOSIS — E782 Mixed hyperlipidemia: Secondary | ICD-10-CM | POA: Diagnosis not present

## 2018-10-21 DIAGNOSIS — K5730 Diverticulosis of large intestine without perforation or abscess without bleeding: Secondary | ICD-10-CM | POA: Diagnosis not present

## 2018-10-21 DIAGNOSIS — Z7951 Long term (current) use of inhaled steroids: Secondary | ICD-10-CM | POA: Insufficient documentation

## 2018-10-21 DIAGNOSIS — Z881 Allergy status to other antibiotic agents status: Secondary | ICD-10-CM | POA: Diagnosis not present

## 2018-10-21 DIAGNOSIS — E785 Hyperlipidemia, unspecified: Secondary | ICD-10-CM | POA: Diagnosis not present

## 2018-10-21 DIAGNOSIS — N183 Chronic kidney disease, stage 3 (moderate): Secondary | ICD-10-CM | POA: Diagnosis not present

## 2018-10-21 DIAGNOSIS — K635 Polyp of colon: Secondary | ICD-10-CM | POA: Diagnosis not present

## 2018-10-21 HISTORY — DX: Type 2 diabetes mellitus with foot ulcer: L97.509

## 2018-10-21 HISTORY — PX: COLONOSCOPY WITH PROPOFOL: SHX5780

## 2018-10-21 HISTORY — DX: Type 2 diabetes mellitus with diabetic neuropathy, unspecified: E11.40

## 2018-10-21 HISTORY — DX: Type 2 diabetes mellitus with foot ulcer: E11.621

## 2018-10-21 SURGERY — COLONOSCOPY WITH PROPOFOL
Anesthesia: General

## 2018-10-21 MED ORDER — MIDAZOLAM HCL 2 MG/2ML IJ SOLN
INTRAMUSCULAR | Status: AC
  Filled 2018-10-21: qty 2

## 2018-10-21 MED ORDER — PROPOFOL 500 MG/50ML IV EMUL
INTRAVENOUS | Status: AC
  Filled 2018-10-21: qty 50

## 2018-10-21 MED ORDER — PROPOFOL 500 MG/50ML IV EMUL
INTRAVENOUS | Status: DC | PRN
  Administered 2018-10-21: 120 ug/kg/min via INTRAVENOUS

## 2018-10-21 MED ORDER — FENTANYL CITRATE (PF) 100 MCG/2ML IJ SOLN
INTRAMUSCULAR | Status: DC | PRN
  Administered 2018-10-21: 100 ug via INTRAVENOUS

## 2018-10-21 MED ORDER — SODIUM CHLORIDE 0.9 % IV SOLN
INTRAVENOUS | Status: DC
  Administered 2018-10-21: 09:00:00 1000 mL via INTRAVENOUS

## 2018-10-21 MED ORDER — MIDAZOLAM HCL 2 MG/2ML IJ SOLN
INTRAMUSCULAR | Status: DC | PRN
  Administered 2018-10-21: 2 mg via INTRAVENOUS

## 2018-10-21 MED ORDER — SODIUM CHLORIDE 0.9 % IV SOLN: INTRAVENOUS | Status: DC

## 2018-10-21 MED ORDER — FENTANYL CITRATE (PF) 100 MCG/2ML IJ SOLN
INTRAMUSCULAR | Status: AC
  Filled 2018-10-21: qty 2

## 2018-10-21 NOTE — H&P (Signed)
Outpatient short stay form Pre-procedure 10/21/2018 9:31 AM Taylor Sails MD  Primary Physician: Dr  Dion Body  Reason for visit: Colonoscopy  History of present illness: Patient is a 73 year old male presenting today for colonoscopy in regards to his personal history of adenomatous colon polyps.  He also had a polyp with high-grade dysplasia removed by surgery in the sigmoid region.  This was in 2008.  Tolerated his prep well.  He takes no aspirin or blood thinning agent with the exception of 81 mg aspirin that he held this morning.    Current Facility-Administered Medications:  .  0.9 %  sodium chloride infusion, , Intravenous, Continuous, Taylor Sails, MD, Last Rate: 20 mL/hr at 10/21/18 0857, 1,000 mL at 10/21/18 0857 .  0.9 %  sodium chloride infusion, , Intravenous, Continuous, Taylor Sails, MD  Medications Prior to Admission  Medication Sig Dispense Refill Last Dose  . albuterol (VENTOLIN HFA) 108 (90 Base) MCG/ACT inhaler Inhale 2 puffs into the lungs every 4 (four) hours as needed.    10/20/2018 at Unknown time  . allopurinol (ZYLOPRIM) 300 MG tablet 300 mg daily.    10/20/2018 at Unknown time  . aspirin EC 81 MG tablet Take 81 mg by mouth daily.    Past Week at Unknown time  . atorvastatin (LIPITOR) 10 MG tablet 10 mg daily.    10/20/2018 at Unknown time  . benzonatate (TESSALON) 200 MG capsule Take 200 mg by mouth 3 (three) times daily as needed for cough.   10/20/2018 at Unknown time  . fenofibrate (TRICOR) 145 MG tablet Take 145 mg by mouth daily.    Past Week at Unknown time  . gabapentin (NEURONTIN) 300 MG capsule Take 300 mg by mouth 3 (three) times daily.   Past Week at Unknown time  . HYDROcodone-homatropine (HYCODAN) 5-1.5 MG/5ML syrup Take 5 mLs by mouth every 6 (six) hours as needed for cough.   Past Week at Unknown time  . losartan (COZAAR) 100 MG tablet Take 100 mg by mouth daily.     Marland Kitchen losartan (COZAAR) 25 MG tablet Take 25 mg by mouth daily.     10/21/2018 at 0545  . tadalafil (CIALIS) 5 MG tablet Take 5 mg by mouth daily as needed for erectile dysfunction.   Past Month at Unknown time  . traMADol (ULTRAM) 50 MG tablet Take 50 mg by mouth every 6 (six) hours as needed.    Past Month at Unknown time  . budesonide-formoterol (SYMBICORT) 160-4.5 MCG/ACT inhaler Inhale 2 puffs into the lungs 2 (two) times daily.   Not Taking at Unknown time  . hydrochlorothiazide (HYDRODIURIL) 25 MG tablet Take 25 mg by mouth daily.     . metoprolol succinate (TOPROL-XL) 100 MG 24 hr tablet Take 100 mg by mouth daily.    Not Taking at Unknown time     Allergies  Allergen Reactions  . Amoxicillin-Pot Clavulanate Rash  . Moxifloxacin Other (See Comments)    Muscle Weakness, Inability to stand  . Prednisone Rash     Past Medical History:  Diagnosis Date  . Allergic rhinitis   . Bacterial meningitis   . Diabetes mellitus without complication ([place])   . Diabetic foot ulcer associated with type 2 diabetes mellitus ([place])   . GERD (gastroesophageal reflux disease)   . Gout   . Hyperlipidemia   . Hypertension   . Neuropathic diabetic ulcer of foot ([place])   . Schwannoma     Review of systems:  Physical Exam    Heart and lungs: Regular rate and rhythm without rub or gallop, lungs are bilaterally clear.    HEENT: Normocephalic atraumatic eyes are anicteric    Other:    Pertinant exam for procedure: Soft nontender nondistended bowel sounds positive normoactive, protuberant.    Planned proceedures: Colonoscopy and indicated procedures. I have discussed the risks benefits and complications of procedures to include not limited to bleeding, infection, perforation and the risk of sedation and the patient wishes to proceed.    Taylor Sails, MD Gastroenterology 10/21/2018  9:31 AM

## 2018-10-21 NOTE — Anesthesia Post-op Follow-up Note (Signed)
Anesthesia QCDR form completed.        

## 2018-10-21 NOTE — Anesthesia Preprocedure Evaluation (Signed)
Anesthesia Evaluation  Patient identified by MRN, date of birth, ID band Patient awake    Reviewed: Allergy & Precautions, NPO status , Patient's Chart, lab work & pertinent test results  Airway Mallampati: III       Dental   Pulmonary asthma , sleep apnea ,           Cardiovascular Exercise Tolerance: Good hypertension, Pt. on medications and Pt. on home beta blockers      Neuro/Psych Hx of meningitus    GI/Hepatic GERD  Medicated,  Endo/Other  diabetesMorbid obesity  Renal/GU      Musculoskeletal   Abdominal   Peds  Hematology   Anesthesia Other Findings   Reproductive/Obstetrics                             Anesthesia Physical  Anesthesia Plan  ASA: III  Anesthesia Plan: General   Post-op Pain Management:    Induction: Intravenous  PONV Risk Score and Plan: Propofol infusion and TIVA  Airway Management Planned: Natural Airway and Nasal Cannula  Additional Equipment:   Intra-op Plan:   Post-operative Plan:   Informed Consent: I have reviewed the patients History and Physical, chart, labs and discussed the procedure including the risks, benefits and alternatives for the proposed anesthesia with the patient or authorized representative who has indicated his/her understanding and acceptance.       Plan Discussed with: CRNA  Anesthesia Plan Comments:         Anesthesia Quick Evaluation

## 2018-10-21 NOTE — Op Note (Signed)
[facility] Patient Name: Taylor Ross Procedure Date: 10/21/2018 9:34 AM MRN: [id number] Account #: [id number] Date of Birth: 1945/04/12 Admit Type: Outpatient Age: 73 Room: [facility] ENDO ROOM 3 Gender: Male Note Status: Finalized Procedure:            Colonoscopy Indications:          Personal history of colonic polyps Providers:            Lollie Sails, MD Referring MD:         Dion Body (Referring MD) Medicines:            Monitored Anesthesia Care Complications:        No immediate complications. Procedure:            Pre-Anesthesia Assessment:                       - ASA Grade Assessment: III - A patient with severe                        systemic disease.                       After obtaining informed consent, the colonoscope was                        passed under direct vision. Throughout the procedure,                        the patient's blood pressure, pulse, and oxygen                        saturations were monitored continuously. The                        Colonoscope was introduced through the anus and                        advanced to the the cecum, identified by appendiceal                        orifice and ileocecal valve. The colonoscopy was                        performed without difficulty. The patient tolerated the                        procedure well. The quality of the bowel preparation                        was good. Findings:      A 4 mm polyp was found in the cecum. The polyp was sessile. The polyp       was removed with a cold snare. Resection and retrieval were complete.      A 2 mm polyp was found in the hepatic flexure. The polyp was sessile.       The polyp was removed with a cold biopsy forceps. Resection and       retrieval were complete.      There was evidence of a prior end-to-end colo-colonic anastomosis at 22       cm proximal to the anus. This was patent  and was characterized by   healthy appearing mucosa. The anastomosis was traversed.      A tattoo was seen in the proximal descending colon. A post-polypectomy       scar was found at the tattoo site. There was no evidence of residual       polyp tissue.      Multiple small and large-mouthed diverticula were found in the sigmoid       colon and descending colon.      Non-bleeding external hemorrhoids were found during retroflexion and       during anoscopy. The hemorrhoids were small and Grade I (internal       hemorrhoids that do not prolapse).      Skin tags were found on perianal exam.      The digital rectal exam was normal. Impression:           - One 4 mm polyp in the cecum, removed with a cold                        snare. Resected and retrieved.                       - One 2 mm polyp at the hepatic flexure, removed with a                        cold biopsy forceps. Resected and retrieved.                       - Patent end-to-end colo-colonic anastomosis,                        characterized by healthy appearing mucosa.                       - A tattoo was seen in the proximal descending colon. A                        post-polypectomy scar was found at the tattoo site.                        There was no evidence of residual polyp tissue.                       - Diverticulosis in the sigmoid colon and in the                        descending colon.                       - Non-bleeding external hemorrhoids.                       - Perianal skin tags found on perianal exam. Recommendation:       - Discharge patient to home.                       - Telephone GI clinic for pathology results in 5 days. Procedure Code(s):    --- Professional ---                       [phone], Colonoscopy, flexible; with removal of tumor(s),  polyp(s), or other lesion(s) by snare technique                       45380, 59, Colonoscopy, flexible; with biopsy, single                        or multiple CPT  copyright 2019 American Medical Association. All rights reserved. The codes documented in this report are preliminary and upon coder review may  be revised to meet current compliance requirements. Lollie Sails, MD 10/21/2018 10:10:54 AM This report has been signed electronically. Number of Addenda: 0 Note Initiated On: 10/21/2018 9:34 AM Scope Withdrawal Time: 0 hours 9 minutes 42 seconds  Total Procedure Duration: 0 hours 20 minutes 3 seconds       [facility]

## 2018-10-21 NOTE — Anesthesia Procedure Notes (Signed)
Performed by: Vaughan Sine Pre-anesthesia Checklist: Patient identified, Emergency Drugs available, Suction available, Timeout performed and Patient being monitored Patient Re-evaluated:Patient Re-evaluated prior to induction Oxygen Delivery Method: Simple face mask Preoxygenation: Pre-oxygenation with 100% oxygen Induction Type: IV induction Placement Confirmation: positive ETCO2 and CO2 detector

## 2018-10-21 NOTE — Transfer of Care (Signed)
Immediate Anesthesia Transfer of Care Note  Patient: Taylor Ross  Procedure(s) Performed: COLONOSCOPY WITH PROPOFOL (N/A )  Patient Location: PACU  Anesthesia Type:General  Level of Consciousness: awake and sedated  Airway & Oxygen Therapy: Patient Spontanous Breathing and Patient connected to nasal cannula oxygen  Post-op Assessment: Report given to RN and Post -op Vital signs reviewed and stable  Post vital signs: Reviewed and stable  Last Vitals:  Vitals Value Taken Time  BP    Temp    Pulse    Resp    SpO2      Last Pain:  Vitals:   10/21/18 0844  TempSrc: Tympanic  PainSc: 0-No pain         Complications: No apparent anesthesia complications

## 2018-10-22 ENCOUNTER — Encounter: Payer: Self-pay | Admitting: Gastroenterology

## 2018-10-22 LAB — SURGICAL PATHOLOGY

## 2018-10-22 NOTE — Anesthesia Postprocedure Evaluation (Signed)
Anesthesia Post Note  Patient: Taylor Ross  Procedure(s) Performed: COLONOSCOPY WITH PROPOFOL (N/A )  Patient location during evaluation: Endoscopy Anesthesia Type: General Level of consciousness: awake and alert and oriented Pain management: pain level controlled Vital Signs Assessment: post-procedure vital signs reviewed and stable Respiratory status: spontaneous breathing Cardiovascular status: blood pressure returned to baseline Anesthetic complications: no     Last Vitals:  Vitals:   10/21/18 1020 10/21/18 1030  BP: 118/73 (!) 151/75  Pulse: (!) 54 (!) 50  Resp: (!) 23 (!) 23  Temp:    SpO2: 98% 99%    Last Pain:  Vitals:   10/22/18 0727  TempSrc:   PainSc: 0-No pain                 Lilygrace Rodick

## 2019-01-22 DIAGNOSIS — E119 Type 2 diabetes mellitus without complications: Secondary | ICD-10-CM | POA: Diagnosis not present

## 2019-01-22 DIAGNOSIS — H401131 Primary open-angle glaucoma, bilateral, mild stage: Secondary | ICD-10-CM | POA: Diagnosis not present

## 2019-01-22 DIAGNOSIS — H5203 Hypermetropia, bilateral: Secondary | ICD-10-CM | POA: Diagnosis not present

## 2019-02-19 DIAGNOSIS — E782 Mixed hyperlipidemia: Secondary | ICD-10-CM | POA: Diagnosis not present

## 2019-02-19 DIAGNOSIS — I10 Essential (primary) hypertension: Secondary | ICD-10-CM | POA: Diagnosis not present

## 2019-02-19 DIAGNOSIS — E1140 Type 2 diabetes mellitus with diabetic neuropathy, unspecified: Secondary | ICD-10-CM | POA: Diagnosis not present

## 2019-02-26 DIAGNOSIS — E1140 Type 2 diabetes mellitus with diabetic neuropathy, unspecified: Secondary | ICD-10-CM | POA: Diagnosis not present

## 2019-02-26 DIAGNOSIS — E782 Mixed hyperlipidemia: Secondary | ICD-10-CM | POA: Diagnosis not present

## 2019-02-26 DIAGNOSIS — I10 Essential (primary) hypertension: Secondary | ICD-10-CM | POA: Diagnosis not present

## 2019-03-24 DIAGNOSIS — R05 Cough: Secondary | ICD-10-CM | POA: Diagnosis not present

## 2019-03-24 DIAGNOSIS — R0602 Shortness of breath: Secondary | ICD-10-CM | POA: Diagnosis not present

## 2019-03-24 DIAGNOSIS — Z20828 Contact with and (suspected) exposure to other viral communicable diseases: Secondary | ICD-10-CM | POA: Diagnosis not present

## 2019-03-26 DIAGNOSIS — U071 COVID-19: Secondary | ICD-10-CM | POA: Diagnosis not present

## 2019-04-01 DIAGNOSIS — R05 Cough: Secondary | ICD-10-CM | POA: Diagnosis not present

## 2019-04-01 DIAGNOSIS — U071 COVID-19: Secondary | ICD-10-CM | POA: Diagnosis not present

## 2019-04-08 DIAGNOSIS — R0902 Hypoxemia: Secondary | ICD-10-CM | POA: Diagnosis not present

## 2019-04-08 DIAGNOSIS — R05 Cough: Secondary | ICD-10-CM | POA: Diagnosis not present

## 2019-04-08 DIAGNOSIS — Z8619 Personal history of other infectious and parasitic diseases: Secondary | ICD-10-CM | POA: Diagnosis not present

## 2019-04-09 DIAGNOSIS — Z8619 Personal history of other infectious and parasitic diseases: Secondary | ICD-10-CM | POA: Diagnosis not present

## 2019-04-09 DIAGNOSIS — R0902 Hypoxemia: Secondary | ICD-10-CM | POA: Diagnosis not present

## 2019-04-09 DIAGNOSIS — R05 Cough: Secondary | ICD-10-CM | POA: Diagnosis not present

## 2019-04-09 DIAGNOSIS — U071 COVID-19: Secondary | ICD-10-CM | POA: Diagnosis not present

## 2019-04-16 DIAGNOSIS — B351 Tinea unguium: Secondary | ICD-10-CM | POA: Diagnosis not present

## 2019-04-16 DIAGNOSIS — E1142 Type 2 diabetes mellitus with diabetic polyneuropathy: Secondary | ICD-10-CM | POA: Diagnosis not present

## 2019-04-16 DIAGNOSIS — E11610 Type 2 diabetes mellitus with diabetic neuropathic arthropathy: Secondary | ICD-10-CM | POA: Diagnosis not present

## 2019-04-20 DIAGNOSIS — R0602 Shortness of breath: Secondary | ICD-10-CM | POA: Diagnosis not present

## 2019-04-20 DIAGNOSIS — Z01818 Encounter for other preprocedural examination: Secondary | ICD-10-CM | POA: Diagnosis not present

## 2019-04-20 DIAGNOSIS — R05 Cough: Secondary | ICD-10-CM | POA: Diagnosis not present

## 2019-05-10 DIAGNOSIS — R0902 Hypoxemia: Secondary | ICD-10-CM | POA: Diagnosis not present

## 2019-05-10 DIAGNOSIS — R05 Cough: Secondary | ICD-10-CM | POA: Diagnosis not present

## 2019-05-10 DIAGNOSIS — Z8619 Personal history of other infectious and parasitic diseases: Secondary | ICD-10-CM | POA: Diagnosis not present

## 2019-05-10 DIAGNOSIS — U071 COVID-19: Secondary | ICD-10-CM | POA: Diagnosis not present

## 2019-05-19 DIAGNOSIS — Z8616 Personal history of COVID-19: Secondary | ICD-10-CM | POA: Diagnosis not present

## 2019-05-19 DIAGNOSIS — R0602 Shortness of breath: Secondary | ICD-10-CM | POA: Diagnosis not present

## 2019-07-28 DIAGNOSIS — L97511 Non-pressure chronic ulcer of other part of right foot limited to breakdown of skin: Secondary | ICD-10-CM | POA: Diagnosis not present

## 2019-07-28 DIAGNOSIS — E1142 Type 2 diabetes mellitus with diabetic polyneuropathy: Secondary | ICD-10-CM | POA: Diagnosis not present

## 2019-07-28 DIAGNOSIS — M79671 Pain in right foot: Secondary | ICD-10-CM | POA: Diagnosis not present

## 2019-07-28 DIAGNOSIS — E11610 Type 2 diabetes mellitus with diabetic neuropathic arthropathy: Secondary | ICD-10-CM | POA: Diagnosis not present

## 2019-08-17 DIAGNOSIS — M1711 Unilateral primary osteoarthritis, right knee: Secondary | ICD-10-CM | POA: Diagnosis not present

## 2019-08-17 DIAGNOSIS — M25561 Pain in right knee: Secondary | ICD-10-CM | POA: Diagnosis not present

## 2019-08-20 DIAGNOSIS — R05 Cough: Secondary | ICD-10-CM | POA: Diagnosis not present

## 2019-08-20 DIAGNOSIS — Z8616 Personal history of COVID-19: Secondary | ICD-10-CM | POA: Diagnosis not present

## 2019-08-25 DIAGNOSIS — E782 Mixed hyperlipidemia: Secondary | ICD-10-CM | POA: Diagnosis not present

## 2019-08-25 DIAGNOSIS — E1140 Type 2 diabetes mellitus with diabetic neuropathy, unspecified: Secondary | ICD-10-CM | POA: Diagnosis not present

## 2019-08-25 DIAGNOSIS — B351 Tinea unguium: Secondary | ICD-10-CM | POA: Diagnosis not present

## 2019-08-25 DIAGNOSIS — I10 Essential (primary) hypertension: Secondary | ICD-10-CM | POA: Diagnosis not present

## 2019-08-25 DIAGNOSIS — E1142 Type 2 diabetes mellitus with diabetic polyneuropathy: Secondary | ICD-10-CM | POA: Diagnosis not present

## 2019-09-01 DIAGNOSIS — E1140 Type 2 diabetes mellitus with diabetic neuropathy, unspecified: Secondary | ICD-10-CM | POA: Diagnosis not present

## 2019-09-01 DIAGNOSIS — E782 Mixed hyperlipidemia: Secondary | ICD-10-CM | POA: Diagnosis not present

## 2019-09-01 DIAGNOSIS — N289 Disorder of kidney and ureter, unspecified: Secondary | ICD-10-CM | POA: Diagnosis not present

## 2019-09-01 DIAGNOSIS — I10 Essential (primary) hypertension: Secondary | ICD-10-CM | POA: Diagnosis not present

## 2019-09-01 DIAGNOSIS — Z0000 Encounter for general adult medical examination without abnormal findings: Secondary | ICD-10-CM | POA: Diagnosis not present

## 2019-11-26 DIAGNOSIS — E1142 Type 2 diabetes mellitus with diabetic polyneuropathy: Secondary | ICD-10-CM | POA: Diagnosis not present

## 2019-11-26 DIAGNOSIS — E11610 Type 2 diabetes mellitus with diabetic neuropathic arthropathy: Secondary | ICD-10-CM | POA: Diagnosis not present

## 2019-11-26 DIAGNOSIS — M2042 Other hammer toe(s) (acquired), left foot: Secondary | ICD-10-CM | POA: Diagnosis not present

## 2019-11-26 DIAGNOSIS — L97521 Non-pressure chronic ulcer of other part of left foot limited to breakdown of skin: Secondary | ICD-10-CM | POA: Diagnosis not present

## 2020-01-07 DIAGNOSIS — I10 Essential (primary) hypertension: Secondary | ICD-10-CM | POA: Diagnosis not present

## 2020-01-07 DIAGNOSIS — E782 Mixed hyperlipidemia: Secondary | ICD-10-CM | POA: Diagnosis not present

## 2020-01-07 DIAGNOSIS — E1140 Type 2 diabetes mellitus with diabetic neuropathy, unspecified: Secondary | ICD-10-CM | POA: Diagnosis not present

## 2020-01-14 DIAGNOSIS — I10 Essential (primary) hypertension: Secondary | ICD-10-CM | POA: Diagnosis not present

## 2020-01-14 DIAGNOSIS — E1140 Type 2 diabetes mellitus with diabetic neuropathy, unspecified: Secondary | ICD-10-CM | POA: Diagnosis not present

## 2020-01-14 DIAGNOSIS — E782 Mixed hyperlipidemia: Secondary | ICD-10-CM | POA: Diagnosis not present

## 2020-01-14 DIAGNOSIS — N289 Disorder of kidney and ureter, unspecified: Secondary | ICD-10-CM | POA: Diagnosis not present

## 2020-01-25 DIAGNOSIS — H1045 Other chronic allergic conjunctivitis: Secondary | ICD-10-CM | POA: Diagnosis not present

## 2020-01-25 DIAGNOSIS — H5203 Hypermetropia, bilateral: Secondary | ICD-10-CM | POA: Diagnosis not present

## 2020-01-25 DIAGNOSIS — H40013 Open angle with borderline findings, low risk, bilateral: Secondary | ICD-10-CM | POA: Diagnosis not present

## 2020-01-25 DIAGNOSIS — E119 Type 2 diabetes mellitus without complications: Secondary | ICD-10-CM | POA: Diagnosis not present

## 2020-01-25 DIAGNOSIS — H02201 Unspecified lagophthalmos right upper eyelid: Secondary | ICD-10-CM | POA: Diagnosis not present

## 2020-02-25 DIAGNOSIS — M2042 Other hammer toe(s) (acquired), left foot: Secondary | ICD-10-CM | POA: Diagnosis not present

## 2020-02-25 DIAGNOSIS — L97521 Non-pressure chronic ulcer of other part of left foot limited to breakdown of skin: Secondary | ICD-10-CM | POA: Diagnosis not present

## 2020-02-25 DIAGNOSIS — E11610 Type 2 diabetes mellitus with diabetic neuropathic arthropathy: Secondary | ICD-10-CM | POA: Diagnosis not present

## 2020-02-25 DIAGNOSIS — E1142 Type 2 diabetes mellitus with diabetic polyneuropathy: Secondary | ICD-10-CM | POA: Diagnosis not present

## 2020-03-07 DIAGNOSIS — H04121 Dry eye syndrome of right lacrimal gland: Secondary | ICD-10-CM | POA: Diagnosis not present

## 2020-03-17 DIAGNOSIS — L97511 Non-pressure chronic ulcer of other part of right foot limited to breakdown of skin: Secondary | ICD-10-CM | POA: Diagnosis not present

## 2020-03-17 DIAGNOSIS — E11610 Type 2 diabetes mellitus with diabetic neuropathic arthropathy: Secondary | ICD-10-CM | POA: Diagnosis not present

## 2020-03-17 DIAGNOSIS — E1142 Type 2 diabetes mellitus with diabetic polyneuropathy: Secondary | ICD-10-CM | POA: Diagnosis not present

## 2020-04-05 DIAGNOSIS — L97512 Non-pressure chronic ulcer of other part of right foot with fat layer exposed: Secondary | ICD-10-CM | POA: Diagnosis not present

## 2020-04-05 DIAGNOSIS — E1142 Type 2 diabetes mellitus with diabetic polyneuropathy: Secondary | ICD-10-CM | POA: Diagnosis not present

## 2020-04-05 DIAGNOSIS — E11610 Type 2 diabetes mellitus with diabetic neuropathic arthropathy: Secondary | ICD-10-CM | POA: Diagnosis not present

## 2020-04-05 DIAGNOSIS — L03115 Cellulitis of right lower limb: Secondary | ICD-10-CM | POA: Diagnosis not present

## 2020-04-12 DIAGNOSIS — E782 Mixed hyperlipidemia: Secondary | ICD-10-CM | POA: Diagnosis not present

## 2020-04-12 DIAGNOSIS — E1140 Type 2 diabetes mellitus with diabetic neuropathy, unspecified: Secondary | ICD-10-CM | POA: Diagnosis not present

## 2020-04-12 DIAGNOSIS — N289 Disorder of kidney and ureter, unspecified: Secondary | ICD-10-CM | POA: Diagnosis not present

## 2020-04-19 DIAGNOSIS — E782 Mixed hyperlipidemia: Secondary | ICD-10-CM | POA: Diagnosis not present

## 2020-04-19 DIAGNOSIS — N1831 Chronic kidney disease, stage 3a: Secondary | ICD-10-CM | POA: Diagnosis not present

## 2020-04-19 DIAGNOSIS — E1142 Type 2 diabetes mellitus with diabetic polyneuropathy: Secondary | ICD-10-CM | POA: Diagnosis not present

## 2020-04-19 DIAGNOSIS — I10 Essential (primary) hypertension: Secondary | ICD-10-CM | POA: Diagnosis not present

## 2020-04-19 DIAGNOSIS — E1140 Type 2 diabetes mellitus with diabetic neuropathy, unspecified: Secondary | ICD-10-CM | POA: Diagnosis not present

## 2020-04-19 DIAGNOSIS — L97512 Non-pressure chronic ulcer of other part of right foot with fat layer exposed: Secondary | ICD-10-CM | POA: Diagnosis not present

## 2020-05-05 DIAGNOSIS — L97512 Non-pressure chronic ulcer of other part of right foot with fat layer exposed: Secondary | ICD-10-CM | POA: Diagnosis not present

## 2020-05-05 DIAGNOSIS — E1142 Type 2 diabetes mellitus with diabetic polyneuropathy: Secondary | ICD-10-CM | POA: Diagnosis not present

## 2020-05-05 DIAGNOSIS — E11610 Type 2 diabetes mellitus with diabetic neuropathic arthropathy: Secondary | ICD-10-CM | POA: Diagnosis not present

## 2020-05-26 DIAGNOSIS — L97512 Non-pressure chronic ulcer of other part of right foot with fat layer exposed: Secondary | ICD-10-CM | POA: Diagnosis not present

## 2020-05-26 DIAGNOSIS — E11610 Type 2 diabetes mellitus with diabetic neuropathic arthropathy: Secondary | ICD-10-CM | POA: Diagnosis not present

## 2020-05-26 DIAGNOSIS — E1142 Type 2 diabetes mellitus with diabetic polyneuropathy: Secondary | ICD-10-CM | POA: Diagnosis not present

## 2020-06-21 DIAGNOSIS — E11610 Type 2 diabetes mellitus with diabetic neuropathic arthropathy: Secondary | ICD-10-CM | POA: Diagnosis not present

## 2020-06-21 DIAGNOSIS — L97512 Non-pressure chronic ulcer of other part of right foot with fat layer exposed: Secondary | ICD-10-CM | POA: Diagnosis not present

## 2020-06-21 DIAGNOSIS — E1142 Type 2 diabetes mellitus with diabetic polyneuropathy: Secondary | ICD-10-CM | POA: Diagnosis not present

## 2020-06-22 DIAGNOSIS — H1045 Other chronic allergic conjunctivitis: Secondary | ICD-10-CM | POA: Diagnosis not present

## 2020-06-22 DIAGNOSIS — H40019 Open angle with borderline findings, low risk, unspecified eye: Secondary | ICD-10-CM | POA: Diagnosis not present

## 2020-06-22 DIAGNOSIS — H02201 Unspecified lagophthalmos right upper eyelid: Secondary | ICD-10-CM | POA: Diagnosis not present

## 2020-06-22 DIAGNOSIS — E119 Type 2 diabetes mellitus without complications: Secondary | ICD-10-CM | POA: Diagnosis not present

## 2020-06-22 DIAGNOSIS — H5203 Hypermetropia, bilateral: Secondary | ICD-10-CM | POA: Diagnosis not present

## 2020-07-12 DIAGNOSIS — E1142 Type 2 diabetes mellitus with diabetic polyneuropathy: Secondary | ICD-10-CM | POA: Diagnosis not present

## 2020-07-12 DIAGNOSIS — L97512 Non-pressure chronic ulcer of other part of right foot with fat layer exposed: Secondary | ICD-10-CM | POA: Diagnosis not present

## 2020-07-12 DIAGNOSIS — E11610 Type 2 diabetes mellitus with diabetic neuropathic arthropathy: Secondary | ICD-10-CM | POA: Diagnosis not present

## 2020-07-28 DIAGNOSIS — Z01818 Encounter for other preprocedural examination: Secondary | ICD-10-CM | POA: Diagnosis not present

## 2020-08-02 ENCOUNTER — Encounter
Admission: RE | Admit: 2020-08-02 | Discharge: 2020-08-02 | Disposition: A | Discharge status: Home / Self Care | Payer: PPO | Source: Ambulatory Visit | Attending: Podiatry | Admitting: Podiatry

## 2020-08-02 ENCOUNTER — Other Ambulatory Visit: Payer: Self-pay

## 2020-08-02 ENCOUNTER — Other Ambulatory Visit: Payer: Self-pay | Admitting: Podiatry

## 2020-08-02 DIAGNOSIS — E1142 Type 2 diabetes mellitus with diabetic polyneuropathy: Secondary | ICD-10-CM | POA: Diagnosis not present

## 2020-08-02 DIAGNOSIS — L97512 Non-pressure chronic ulcer of other part of right foot with fat layer exposed: Secondary | ICD-10-CM | POA: Diagnosis not present

## 2020-08-02 DIAGNOSIS — E11610 Type 2 diabetes mellitus with diabetic neuropathic arthropathy: Secondary | ICD-10-CM | POA: Diagnosis not present

## 2020-08-02 NOTE — Progress Notes (Signed)
Perioperative Services Pre-Admission/Anesthesia Testing   Date: 08/02/20 Name: CLAY SOLUM MRN:   [id number]  Re: Consideration of preoperative prophylactic antibiotic change   Request sent to: Samara Deist, DPM (routed and/or faxed via [facility])  Planned Surgical Procedure(s):    Case: [id number] Date/Time: 08/12/20 0850   Procedure: EXOSTOSIS EXCISION- SADDLEBONE X2- RIGHT (Right )   Anesthesia type: Choice   Pre-op diagnosis: L97.512- ULCER OF RIGHT FOOT WITH FAT LAYER EXPOSED   Location: [place] 01 / [place] [facility]   Surgeons: Samara Deist, DPM    Notes: 1. Patient has a documented allergy to AUGMENTIN only . Advising that Augmentin has caused him to experience rash in the past.   2. Received  3rd generation cephalosporin with no documented complications . CEFPODOXIME received on 10/15/2015  3. Screened as appropriate for cephalosporin use during medication reconciliation . No immediate angioedema, dysphagia, SOB, anaphylaxis symptoms. . No severe rash involving mucous membranes or skin necrosis. . No hospital admissions related to side effects of PCN/cephalosporin use.  . No documented reaction to PCN or cephalosporin in the last 10 years.  Request:  As an evidence based approach to reducing the rate of incidence for post-operative SSI and the development of MDROs, could an agent with narrower coverage for preoperative prophylaxis in this patient's upcoming surgical course be considered?   1. Currently ordered preoperative prophylactic ABX: clindamycin.   2. Specifically requesting change to cephalosporin (CEFAZOLIN).   3. Please communicate decision with me and I will change the orders in Epic as per your direction.   Things to consider:  Many patients report that they were "allergic" to PCN earlier in life, however this does not translate into a true lifelong allergy. Patients can lose sensitivity to specific IgE antibodies over time if PCN is  avoided (Kleris & Lugar, 2019).   Up to 10% of the adult population and 15% of hospitalized patients report an allergy to PCN, however clinical studies suggest that 90% of those reporting an allergy can tolerate PCN antibiotics (Kleris & Lugar, 2019).   Cross-sensitivity between PCN and cephalosporins has been documented as being as high as 10%, however this estimation included data believed to have been collected in a setting where there was contamination. Newer data suggests that the prevalence of cross-sensitivity between PCN and cephalosporins is actually estimated to be closer to 1% (Hermanides et al., 2018).    Patients labeled as PCN allergic, whether they are truly allergic or not, have been found to have inferior outcomes in terms of rates of serious infection, and these patients tend to have longer hospital stays ([place], 2019).   Treatment related secondary infections, such as Clostridioides difficile, have been linked to the improper use of broad spectrum antibiotics in patients improperly labeled as PCN allergic (Kleris & Lugar, 2019).   Anaphylaxis from cephalosporins is rare and the evidence suggests that there is no increased risk of an anaphylactic type reaction when cephalosporins are used in a PCN allergic patient (Pichichero, 2006).  Citations: Hermanides J, Lemkes BA, Prins Pearla Dubonnet MW, [place] I. Presumed ?-Lactam Allergy and Cross-reactivity in the Operating Theater: A Practical Approach. Anesthesiology. 2018 Aug;129(2):335-342. doi: 10.1097/ALN.0000000000002252. PMID: 09381829.  Kleris, [place]., & Lugar, P. L. (2019). Things We Do For No Reason: Failing to Question a Penicillin Allergy History. Journal of hospital medicine, 14(10), 7634374655. Advance online publication. https://www.wallace-middleton.info/  Pichichero, M. E. (2006). Cephalosporins can be prescribed safely for penicillin-allergic patients. Journal of family medicine, 55(2),  106-112. Accessed:  https://cdn.mdedge.com/files/s27fs-public/Document/September-2017/5502JFP_AppliedEvidence1.pdf   Honor Loh, MSN, APRN, FNP-C, CEN [facility]  Peri-operative Services Nurse Practitioner FAX: [phone] 08/02/20 1:08 PM

## 2020-08-02 NOTE — Patient Instructions (Signed)
Your procedure is scheduled on: 08/12/20 Report to [place]. To find out your arrival time please call [phone] between 1PM - 3PM on 08/11/20.  Remember: Instructions that are not followed completely may result in serious medical risk, up to and including death, or upon the discretion of your surgeon and anesthesiologist your surgery may need to be rescheduled.     _X__ 1. Do not eat food after midnight the night before your procedure.                 No gum chewing or hard candies. You may drink clear liquids up to 2 hours                 before you are scheduled to arrive for your surgery- DO not drink clear                 liquids within 2 hours of the start of your surgery.                 Clear Liquids include:  water, apple juice without pulp, clear carbohydrate                 drink such as Clearfast or Gatorade, Black Coffee or Tea (Do not add                 anything to coffee or tea). Diabetics water only  __X__2.  On the morning of surgery brush your teeth with toothpaste and water, you                 may rinse your mouth with mouthwash if you wish.  Do not swallow any              toothpaste of mouthwash.     _X__ 3.  No Alcohol for 24 hours before or after surgery.   _X__ 4.  Do Not Smoke or use e-cigarettes For 24 Hours Prior to Your Surgery.                 Do not use any chewable tobacco products for at least 6 hours prior to                 surgery.  ____  5.  Bring all medications with you on the day of surgery if instructed.   __X__  6.  Notify your doctor if there is any change in your medical condition      (cold, fever, infections).     Do not wear jewelry, make-up, hairpins, clips or nail polish. Do not wear lotions, powders, or perfumes. You may use deodorant. Do not shave 48 hours prior to surgery. Men may shave face and neck. Do not bring valuables to the hospital.    [facility] is not responsible  for any belongings or valuables.  Contacts, dentures/partials or body piercings may not be worn into surgery. Bring a case for your contacts, glasses or hearing aids, a denture cup will be supplied. Leave your suitcase in the car. After surgery it may be brought to your room. For patients admitted to the hospital, discharge time is determined by your treatment team.   Patients discharged the day of surgery will not be allowed to drive home.   Please read over the following fact sheets that you were given:   MRSA Information  __X__ Take these medicines the morning of surgery with A SIP OF  WATER:    1. allopurinol (ZYLOPRIM) 300 MG tablet  2. amLODipine (NORVASC) 10 MG tablet  3. atorvastatin (LIPITOR) 20 MG tablet  4. fenofibrate (TRICOR) 145 MG tablet  5. gabapentin (NEURONTIN) 600 MG tablet  6. hydrALAZINE (APRESOLINE) 25 MG tablet  ____ Fleet Enema (as directed)   __X__ Use CHG Soap/SAGE wipes as directed  ____ Use inhalers on the day of surgery  ____ Stop metformin/Janumet/Farxiga 2 days prior to surgery    ____ Take 1/2 of usual insulin dose the night before surgery. No insulin the morning          of surgery.   ____ Stop Blood Thinners Coumadin/Plavix/Xarelto/Pleta/Pradaxa/Eliquis/Effient/Aspirin  on   Or contact your Surgeon, Cardiologist or Medical Doctor regarding  ability to stop your blood thinners  __X__ Stop Anti-inflammatories 7 days before surgery such as Advil, Ibuprofen, Motrin,  BC or Goodies Powder, Naprosyn, Naproxen, Aleve, Aspirin    __X__ Stop all herbal supplements, fish oil or vitamin E until after surgery. Vitamin C   ____ Bring C-Pap to the hospital.

## 2020-08-08 ENCOUNTER — Other Ambulatory Visit
Admission: RE | Admit: 2020-08-08 | Discharge: 2020-08-08 | Disposition: A | Discharge status: Home / Self Care | Payer: PPO | Source: Ambulatory Visit | Attending: Podiatry | Admitting: Podiatry

## 2020-08-08 ENCOUNTER — Other Ambulatory Visit: Payer: Self-pay

## 2020-08-08 DIAGNOSIS — Z01810 Encounter for preprocedural cardiovascular examination: Secondary | ICD-10-CM | POA: Insufficient documentation

## 2020-08-08 DIAGNOSIS — Z01818 Encounter for other preprocedural examination: Secondary | ICD-10-CM | POA: Diagnosis not present

## 2020-08-10 ENCOUNTER — Other Ambulatory Visit: Payer: PPO

## 2020-08-12 ENCOUNTER — Other Ambulatory Visit: Payer: Self-pay

## 2020-08-12 ENCOUNTER — Encounter
Admission: RE | Disposition: A | Discharge status: Home / Self Care | Payer: Self-pay | Source: Home / Self Care | Attending: Podiatry

## 2020-08-12 ENCOUNTER — Encounter: Payer: Self-pay | Admitting: Podiatry

## 2020-08-12 ENCOUNTER — Ambulatory Visit: Payer: PPO | Admitting: Certified Registered Nurse Anesthetist

## 2020-08-12 ENCOUNTER — Ambulatory Visit
Admission: RE | Admit: 2020-08-12 | Discharge: 2020-08-12 | Disposition: A | Discharge status: Home / Self Care | Payer: PPO | Attending: Podiatry | Admitting: Podiatry

## 2020-08-12 ENCOUNTER — Ambulatory Visit: Payer: PPO

## 2020-08-12 DIAGNOSIS — M898X7 Other specified disorders of bone, ankle and foot: Secondary | ICD-10-CM | POA: Diagnosis not present

## 2020-08-12 DIAGNOSIS — M899 Disorder of bone, unspecified: Secondary | ICD-10-CM | POA: Diagnosis not present

## 2020-08-12 DIAGNOSIS — Z7982 Long term (current) use of aspirin: Secondary | ICD-10-CM | POA: Diagnosis not present

## 2020-08-12 DIAGNOSIS — Z881 Allergy status to other antibiotic agents status: Secondary | ICD-10-CM | POA: Diagnosis not present

## 2020-08-12 DIAGNOSIS — Z79899 Other long term (current) drug therapy: Secondary | ICD-10-CM | POA: Diagnosis not present

## 2020-08-12 DIAGNOSIS — E11610 Type 2 diabetes mellitus with diabetic neuropathic arthropathy: Secondary | ICD-10-CM | POA: Diagnosis not present

## 2020-08-12 DIAGNOSIS — Z794 Long term (current) use of insulin: Secondary | ICD-10-CM | POA: Diagnosis not present

## 2020-08-12 DIAGNOSIS — Z886 Allergy status to analgesic agent status: Secondary | ICD-10-CM | POA: Insufficient documentation

## 2020-08-12 DIAGNOSIS — L97519 Non-pressure chronic ulcer of other part of right foot with unspecified severity: Secondary | ICD-10-CM | POA: Diagnosis not present

## 2020-08-12 DIAGNOSIS — Z888 Allergy status to other drugs, medicaments and biological substances status: Secondary | ICD-10-CM | POA: Diagnosis not present

## 2020-08-12 DIAGNOSIS — E1142 Type 2 diabetes mellitus with diabetic polyneuropathy: Secondary | ICD-10-CM | POA: Diagnosis not present

## 2020-08-12 DIAGNOSIS — E11621 Type 2 diabetes mellitus with foot ulcer: Secondary | ICD-10-CM | POA: Insufficient documentation

## 2020-08-12 DIAGNOSIS — M25774 Osteophyte, right foot: Secondary | ICD-10-CM | POA: Diagnosis not present

## 2020-08-12 DIAGNOSIS — L97512 Non-pressure chronic ulcer of other part of right foot with fat layer exposed: Secondary | ICD-10-CM | POA: Diagnosis not present

## 2020-08-12 HISTORY — PX: BONE EXOSTOSIS EXCISION: SHX1249

## 2020-08-12 LAB — GLUCOSE, CAPILLARY: Glucose-Capillary: 143 mg/dL — ABNORMAL HIGH (ref 70–99)

## 2020-08-12 SURGERY — EXCISION, EXOSTOSIS
Anesthesia: General | Site: Foot | Laterality: Right

## 2020-08-12 MED ORDER — LIDOCAINE-EPINEPHRINE 1 %-1:100000 IJ SOLN
INTRAMUSCULAR | Status: AC
  Filled 2020-08-12: qty 1

## 2020-08-12 MED ORDER — EPHEDRINE 5 MG/ML INJ
INTRAVENOUS | Status: AC
  Filled 2020-08-12: qty 10

## 2020-08-12 MED ORDER — ATROPINE SULFATE 0.4 MG/ML IJ SOLN
INTRAMUSCULAR | Status: DC | PRN
  Administered 2020-08-12: .2 mg via INTRAVENOUS

## 2020-08-12 MED ORDER — SODIUM CHLORIDE 0.9 % IV SOLN: INTRAVENOUS | Status: DC

## 2020-08-12 MED ORDER — VASOPRESSIN 20 UNIT/ML IV SOLN
INTRAVENOUS | Status: AC
  Filled 2020-08-12: qty 1

## 2020-08-12 MED ORDER — GLYCOPYRROLATE 0.2 MG/ML IJ SOLN
INTRAMUSCULAR | Status: DC | PRN
  Administered 2020-08-12: .2 mg via INTRAVENOUS

## 2020-08-12 MED ORDER — LIDOCAINE HCL (PF) 1 % IJ SOLN
INTRAMUSCULAR | Status: AC
  Filled 2020-08-12: qty 30

## 2020-08-12 MED ORDER — ONDANSETRON HCL 4 MG/2ML IJ SOLN
INTRAMUSCULAR | Status: AC
  Filled 2020-08-12: qty 2

## 2020-08-12 MED ORDER — GLYCOPYRROLATE 0.2 MG/ML IJ SOLN
INTRAMUSCULAR | Status: AC
  Filled 2020-08-12: qty 1

## 2020-08-12 MED ORDER — LIDOCAINE HCL (CARDIAC) PF 100 MG/5ML IV SOSY
PREFILLED_SYRINGE | INTRAVENOUS | Status: DC | PRN
  Administered 2020-08-12: 100 mg via INTRAVENOUS

## 2020-08-12 MED ORDER — ACETAMINOPHEN 10 MG/ML IV SOLN
INTRAVENOUS | Status: AC
  Filled 2020-08-12: qty 100

## 2020-08-12 MED ORDER — DEXAMETHASONE SODIUM PHOSPHATE 10 MG/ML IJ SOLN
INTRAMUSCULAR | Status: AC
  Filled 2020-08-12: qty 1

## 2020-08-12 MED ORDER — PROPOFOL 10 MG/ML IV BOLUS
INTRAVENOUS | Status: DC | PRN
  Administered 2020-08-12: 180 mg via INTRAVENOUS

## 2020-08-12 MED ORDER — METOCLOPRAMIDE HCL 10 MG PO TABS
5.0000 mg | ORAL_TABLET | Freq: Three times a day (TID) | ORAL | Status: DC | PRN
Start: 2020-08-12 — End: 2020-08-12

## 2020-08-12 MED ORDER — ONDANSETRON HCL 4 MG/2ML IJ SOLN
INTRAMUSCULAR | Status: DC | PRN
  Administered 2020-08-12: 4 mg via INTRAVENOUS

## 2020-08-12 MED ORDER — ORAL CARE MOUTH RINSE: 15.0000 mL | Freq: Once | OROMUCOSAL | Status: AC

## 2020-08-12 MED ORDER — CLINDAMYCIN PHOSPHATE 900 MG/50ML IV SOLN
INTRAVENOUS | Status: AC
  Filled 2020-08-12: qty 50

## 2020-08-12 MED ORDER — BUPIVACAINE HCL (PF) 0.5 % IJ SOLN
INTRAMUSCULAR | Status: AC
  Filled 2020-08-12: qty 30

## 2020-08-12 MED ORDER — CLINDAMYCIN PHOSPHATE 900 MG/50ML IV SOLN
900.0000 mg | INTRAVENOUS | Status: AC
  Administered 2020-08-12: 900 mg via INTRAVENOUS

## 2020-08-12 MED ORDER — ACETAMINOPHEN 10 MG/ML IV SOLN
INTRAVENOUS | Status: DC | PRN
  Administered 2020-08-12: 1000 mg via INTRAVENOUS

## 2020-08-12 MED ORDER — OXYCODONE-ACETAMINOPHEN 5-325 MG PO TABS: 1.0000 | ORAL_TABLET | Freq: Four times a day (QID) | ORAL | 0 refills | Status: AC | PRN

## 2020-08-12 MED ORDER — ONDANSETRON HCL 4 MG/2ML IJ SOLN: 4.0000 mg | Freq: Once | INTRAMUSCULAR | Status: DC | PRN

## 2020-08-12 MED ORDER — LIDOCAINE HCL (PF) 2 % IJ SOLN
INTRAMUSCULAR | Status: AC
  Filled 2020-08-12: qty 5

## 2020-08-12 MED ORDER — ONDANSETRON HCL 4 MG PO TABS: 4.0000 mg | ORAL_TABLET | Freq: Four times a day (QID) | ORAL | Status: DC | PRN

## 2020-08-12 MED ORDER — POVIDONE-IODINE 7.5 % EX SOLN
Freq: Once | CUTANEOUS | Status: DC
  Filled 2020-08-12: qty 118

## 2020-08-12 MED ORDER — ONDANSETRON HCL 4 MG/2ML IJ SOLN: 4.0000 mg | Freq: Four times a day (QID) | INTRAMUSCULAR | Status: DC | PRN

## 2020-08-12 MED ORDER — CHLORHEXIDINE GLUCONATE 0.12 % MT SOLN
OROMUCOSAL | Status: AC
  Filled 2020-08-12: qty 15

## 2020-08-12 MED ORDER — FENTANYL CITRATE (PF) 100 MCG/2ML IJ SOLN
INTRAMUSCULAR | Status: AC
  Filled 2020-08-12: qty 2

## 2020-08-12 MED ORDER — METOCLOPRAMIDE HCL 5 MG/ML IJ SOLN: 5.0000 mg | Freq: Three times a day (TID) | INTRAMUSCULAR | Status: DC | PRN

## 2020-08-12 MED ORDER — VASOPRESSIN 20 UNIT/ML IV SOLN
INTRAVENOUS | Status: DC | PRN
  Administered 2020-08-12 (×3): 2 [IU] via INTRAVENOUS

## 2020-08-12 MED ORDER — BUPIVACAINE HCL (PF) 0.5 % IJ SOLN
INTRAMUSCULAR | Status: DC | PRN
  Administered 2020-08-12: 20 mL

## 2020-08-12 MED ORDER — FENTANYL CITRATE (PF) 100 MCG/2ML IJ SOLN: 25.0000 ug | INTRAMUSCULAR | Status: DC | PRN

## 2020-08-12 MED ORDER — FAMOTIDINE 20 MG PO TABS
20.0000 mg | ORAL_TABLET | Freq: Once | ORAL | Status: AC
  Administered 2020-08-12: 20 mg via ORAL

## 2020-08-12 MED ORDER — FAMOTIDINE 20 MG PO TABS
ORAL_TABLET | ORAL | Status: AC
  Filled 2020-08-12: qty 1

## 2020-08-12 MED ORDER — CHLORHEXIDINE GLUCONATE 0.12 % MT SOLN
15.0000 mL | Freq: Once | OROMUCOSAL | Status: AC
  Administered 2020-08-12: 15 mL via OROMUCOSAL

## 2020-08-12 MED ORDER — FENTANYL CITRATE (PF) 100 MCG/2ML IJ SOLN
INTRAMUSCULAR | Status: DC | PRN
  Administered 2020-08-12: 25 ug via INTRAVENOUS

## 2020-08-12 MED ORDER — LIDOCAINE-EPINEPHRINE 1 %-1:100000 IJ SOLN
INTRAMUSCULAR | Status: DC | PRN
  Administered 2020-08-12: 5 mL

## 2020-08-12 MED ORDER — ATROPINE SULFATE 0.4 MG/ML IJ SOLN
INTRAMUSCULAR | Status: AC
  Filled 2020-08-12: qty 1

## 2020-08-12 MED ORDER — EPHEDRINE SULFATE 50 MG/ML IJ SOLN
INTRAMUSCULAR | Status: DC | PRN
  Administered 2020-08-12: 10 mg via INTRAVENOUS

## 2020-08-12 MED ORDER — DEXAMETHASONE SODIUM PHOSPHATE 10 MG/ML IJ SOLN
INTRAMUSCULAR | Status: DC | PRN
  Administered 2020-08-12: 5 mg via INTRAVENOUS

## 2020-08-12 SURGICAL SUPPLY — 60 items
BLADE OSC/SAGITTAL MD 5.5X18 (BLADE) IMPLANT
BLADE OSCILLATING/SAGITTAL (BLADE) ×2
BLADE SURG 15 STRL LF DISP TIS (BLADE) ×2 IMPLANT
BLADE SURG 15 STRL SS (BLADE) ×4
BLADE SW THK.38XMED LNG THN (BLADE) ×1 IMPLANT
BNDG COHESIVE 4X5 TAN STRL (GAUZE/BANDAGES/DRESSINGS) ×2 IMPLANT
BNDG COHESIVE 6X5 TAN STRL LF (GAUZE/BANDAGES/DRESSINGS) ×2 IMPLANT
BNDG CONFORM 2 STRL LF (GAUZE/BANDAGES/DRESSINGS) ×2 IMPLANT
BNDG CONFORM 3 STRL LF (GAUZE/BANDAGES/DRESSINGS) ×2 IMPLANT
BNDG ELASTIC 4X5.8 VLCR STR LF (GAUZE/BANDAGES/DRESSINGS) ×2 IMPLANT
BNDG ESMARK 4X12 TAN STRL LF (GAUZE/BANDAGES/DRESSINGS) ×2 IMPLANT
BNDG GAUZE 4.5X4.1 6PLY STRL (MISCELLANEOUS) ×2 IMPLANT
BOOT STEPPER DURA LG (SOFTGOODS) ×2 IMPLANT
CANISTER SUCT 1200ML W/VALVE (MISCELLANEOUS) IMPLANT
COVER WAND RF STERILE (DRAPES) ×2 IMPLANT
CUFF TOURN SGL QUICK 12 (TOURNIQUET CUFF) IMPLANT
CUFF TOURN SGL QUICK 18X4 (TOURNIQUET CUFF) ×2 IMPLANT
DRAPE FLUOR MINI C-ARM 54X84 (DRAPES) ×2 IMPLANT
DURAPREP 26ML APPLICATOR (WOUND CARE) ×2 IMPLANT
ELECT REM PT RETURN 9FT ADLT (ELECTROSURGICAL) ×2
ELECTRODE REM PT RTRN 9FT ADLT (ELECTROSURGICAL) ×1 IMPLANT
GAUZE PACKING 1/4 X5 YD (GAUZE/BANDAGES/DRESSINGS) ×2 IMPLANT
GAUZE PACKING IODOFORM 1X5 (PACKING) ×2 IMPLANT
GAUZE SPONGE 4X4 12PLY STRL (GAUZE/BANDAGES/DRESSINGS) ×2 IMPLANT
GAUZE XEROFORM 1X8 LF (GAUZE/BANDAGES/DRESSINGS) ×2 IMPLANT
GLOVE SURG ENC MOIS LTX SZ7.5 (GLOVE) ×2 IMPLANT
GLOVE SURG UNDER LTX SZ8 (GLOVE) ×2 IMPLANT
GOWN STRL REUS W/ TWL XL LVL3 (GOWN DISPOSABLE) ×2 IMPLANT
GOWN STRL REUS W/TWL XL LVL3 (GOWN DISPOSABLE) ×4
HANDPIECE VERSAJET DEBRIDEMENT (MISCELLANEOUS) IMPLANT
IV NS 1000ML (IV SOLUTION) ×2
IV NS 1000ML BAXH (IV SOLUTION) ×1 IMPLANT
IV NS IRRIG 3000ML ARTHROMATIC (IV SOLUTION) IMPLANT
LABEL OR SOLS (LABEL) ×2 IMPLANT
MANIFOLD NEPTUNE II (INSTRUMENTS) ×2 IMPLANT
NEEDLE FILTER BLUNT 18X 1/2SAF (NEEDLE) ×1
NEEDLE FILTER BLUNT 18X1 1/2 (NEEDLE) ×1 IMPLANT
NEEDLE HYPO 25X1 1.5 SAFETY (NEEDLE) ×6 IMPLANT
NS IRRIG 500ML POUR BTL (IV SOLUTION) ×2 IMPLANT
PACK EXTREMITY ARMC (MISCELLANEOUS) ×2 IMPLANT
PAD ABD DERMACEA PRESS 5X9 (GAUZE/BANDAGES/DRESSINGS) ×2 IMPLANT
PENCIL ELECTRO HAND CTR (MISCELLANEOUS) ×2 IMPLANT
PULSAVAC PLUS IRRIG FAN TIP (DISPOSABLE) ×2
RASP SM TEAR CROSS CUT (RASP) ×2 IMPLANT
SHIELD FULL FACE ANTIFOG 7M (MISCELLANEOUS) ×2 IMPLANT
STOCKINETTE IMPERVIOUS 9X36 MD (GAUZE/BANDAGES/DRESSINGS) ×2 IMPLANT
STRAP SAFETY 5IN WIDE (MISCELLANEOUS) ×2 IMPLANT
SUT ETHILON 2 0 FS 18 (SUTURE) ×2 IMPLANT
SUT ETHILON 4-0 (SUTURE) ×4
SUT ETHILON 4-0 FS2 18XMFL BLK (SUTURE) ×2
SUT VIC AB 2-0 SH 27 (SUTURE) ×2
SUT VIC AB 2-0 SH 27XBRD (SUTURE) ×1 IMPLANT
SUT VIC AB 3-0 SH 27 (SUTURE) ×2
SUT VIC AB 3-0 SH 27X BRD (SUTURE) ×1 IMPLANT
SUT VIC AB 4-0 FS2 27 (SUTURE) ×2 IMPLANT
SUTURE ETHLN 4-0 FS2 18XMF BLK (SUTURE) ×2 IMPLANT
SWAB CULTURE AMIES ANAERIB BLU (MISCELLANEOUS) ×2 IMPLANT
SYR 10ML LL (SYRINGE) ×4 IMPLANT
SYR 3ML LL SCALE MARK (SYRINGE) ×2 IMPLANT
TIP FAN IRRIG PULSAVAC PLUS (DISPOSABLE) ×1 IMPLANT

## 2020-08-12 NOTE — Transfer of Care (Signed)
Immediate Anesthesia Transfer of Care Note  Patient: Taylor Ross  Procedure(s) Performed: EXOSTOSIS EXCISION- SADDLEBONE X2- RIGHT (Right Foot)  Patient Location: PACU  Anesthesia Type:General  Level of Consciousness: awake, alert  and oriented  Airway & Oxygen Therapy: Patient Spontanous Breathing  Post-op Assessment: Report given to RN and Post -op Vital signs reviewed and stable  Post vital signs: Reviewed and stable  Last Vitals:  Vitals Value Taken Time  BP 131/75 08/12/20 1051  Temp    Pulse 51 08/12/20 1056  Resp 14 08/12/20 1056  SpO2 98 % 08/12/20 1056  Vitals shown include unvalidated device data.  Last Pain: There were no vitals filed for this visit.       Complications: No complications documented.

## 2020-08-12 NOTE — Anesthesia Postprocedure Evaluation (Signed)
Anesthesia Post Note  Patient: Taylor Ross  Procedure(s) Performed: EXOSTOSIS EXCISION- SADDLEBONE X2- RIGHT (Right Foot)  Patient location during evaluation: PACU Anesthesia Type: General Level of consciousness: awake and alert Pain management: pain level controlled Vital Signs Assessment: post-procedure vital signs reviewed and stable Respiratory status: spontaneous breathing, nonlabored ventilation, respiratory function stable and patient connected to nasal cannula oxygen Cardiovascular status: blood pressure returned to baseline and stable Postop Assessment: no apparent nausea or vomiting Anesthetic complications: no   No complications documented.   Last Vitals:  Vitals:   08/12/20 1156 08/12/20 1208  BP: 122/61 131/60  Pulse: (!) 54 64  Resp: 16 18  Temp: 36.7 C (!) 35.9 C  SpO2: 99% 97%    Last Pain:  Vitals:   08/12/20 1208  TempSrc: Temporal  PainSc: 0-No pain                 Martha Clan

## 2020-08-12 NOTE — H&P (Signed)
HISTORY AND PHYSICAL INTERVAL NOTE:  08/12/2020  9:05 AM  Frederich Balding  has presented today for surgery, with the diagnosis of L97.512- ULCER OF RIGHT FOOT WITH FAT LAYER EXPOSED.  The various methods of treatment have been discussed with the patient.  No guarantees were given.  After consideration of risks, benefits and other options for treatment, the patient has consented to surgery.  I have reviewed the patients' chart and labs.     A history and physical examination was performed in my office.  The patient was reexamined.  There have been no changes to this history and physical examination.  Samara Deist A

## 2020-08-12 NOTE — Discharge Instructions (Signed)
[place] DR. TROXLER, DR. Vickki Muff, AND DR. [place]   1. Take your medication as prescribed.  Pain medication should be taken only as needed.  2. Keep the dressing clean, dry and intact.  3. Keep your foot elevated above the heart level for the first 48 hours.  4. We have instructed you to be non-weight bearing.  5. Always wear your post-op shoe when walking.  Always use your crutches if you are to be non-weight bearing.  6. Do not take a shower. Baths are permissible as long as the foot is kept out of the water.   7. Every hour you are awake:  - Bend your knee 15 times. - Massage calf 15 times  8. Call [facility] [phone]) if any of the following problems occur: - You develop a temperature or fever. - The bandage becomes saturated with blood. - Medication does not stop your pain. - Injury of the foot occurs. - Any symptoms of infection including redness, odor, or red streaks running from wound.   AMBULATORY SURGERY  DISCHARGE INSTRUCTIONS   1) The drugs that you were given will stay in your system until tomorrow so for the next 24 hours you should not:  A) Drive an automobile B) Make any legal decisions C) Drink any alcoholic beverage   2) You may resume regular meals tomorrow.  Today it is better to start with liquids and gradually work up to solid foods.  You may eat anything you prefer, but it is better to start with liquids, then soup and crackers, and gradually work up to solid foods.   3) Please notify your doctor immediately if you have any unusual bleeding, trouble breathing, redness and pain at the surgery site, drainage, fever, or pain not relieved by medication.    4) Additional Instructions:        Please contact your physician with any problems or Same Day Surgery at [phone], Monday through Friday 6 am to 4 pm, or [facility] number at [phone].

## 2020-08-12 NOTE — Op Note (Signed)
Operative note   Surgeon:Jahaad Penado Lawyer: None    Preop diagnosis: Exostosis right midfoot    Postop diagnosis: Same    Procedure: 1 excision exostosis plantar first metatarsal to excision exostosis plantar medial cuneiform all right foot    EBL: Minimal    Anesthesia:local and general    Hemostasis: Ankle tourniquet inflated to 200 mmHg for approximately 45 minutes    Specimen: Bone for culture as well as pathology    Complications: None    Operative indications:Taylor Ross is an 75 y.o. that presents today for surgical intervention.  The risks/benefits/alternatives/complications have been discussed and consent has been given.    Procedure:  Patient was brought into the OR and placed on the operating table in thesupine position. After anesthesia was obtained theright lower extremity was prepped and draped in usual sterile fashion.  Attention was directed to the medial aspect of the right foot where the ulcerative site was noted.  Initially the superficial tissue was removed and there was no full-thickness ulceration.  At this point a medial incision was made overlying the first met cuneiform joint region.  Sharp and blunt dissection carried down to the deeper periosteum.  Subperiosteal dissection was then undertaken.  At this time a noted exostosis was found at the first met cuneiform joint.  Initially with a power the exostosis on the plantar aspect of the first metatarsal and plantar aspect of the medial cuneiform was removed.  A portion was sent for culture and a second portion was sent for pathology review.  Further contouring of this was performed with the power saw and a power rasp to remove any prominence to the area.  The wound was flushed with copious amounts of irrigation.  At this time layered closure was performed with a 2-0 Vicryl for the deeper tissue.  The skin was reapproximated with a 2-0 nylon.  A large bulky sterile dressing was applied.  Patient was  then placed in an equalizer walker boot with the foot in neutral position.    Patient tolerated the procedure and anesthesia well.  Was transported from the OR to the PACU with all vital signs stable and vascular status intact. To be discharged per routine protocol.  Will follow up in approximately 1 week in the outpatient clinic.

## 2020-08-12 NOTE — Anesthesia Preprocedure Evaluation (Signed)
Anesthesia Evaluation  Patient identified by MRN, date of birth, ID band Patient awake    Reviewed: Allergy & Precautions, NPO status , Patient's Chart, lab work & pertinent test results  History of Anesthesia Complications Negative for: history of anesthetic complications  Airway Mallampati: III       Dental  (+) Dental Advidsory Given Bridge on the left:   Pulmonary neg shortness of breath, asthma , sleep apnea , neg COPD, neg recent URI,           Cardiovascular Exercise Tolerance: Good hypertension, Pt. on medications and Pt. on home beta blockers (-) angina(-) Past MI and (-) Cardiac Stents (-) dysrhythmias (-) Valvular Problems/Murmurs     Neuro/Psych neg Seizures Hx of meningitus    GI/Hepatic Neg liver ROS, GERD  Medicated,  Endo/Other  diabetesMorbid obesity  Renal/GU CRFRenal disease     Musculoskeletal   Abdominal   Peds  Hematology   Anesthesia Other Findings Past Medical History: No date: Allergic rhinitis No date: Bacterial meningitis No date: Diabetes mellitus without complication (HCC) No date: Diabetic foot ulcer associated with type 2 diabetes mellitus  (HCC) No date: GERD (gastroesophageal reflux disease) No date: Gout No date: Hyperlipidemia No date: Hypertension No date: Neuropathic diabetic ulcer of foot ([place]) No date: Schwannoma   Reproductive/Obstetrics                             Anesthesia Physical  Anesthesia Plan  ASA: III  Anesthesia Plan: General   Post-op Pain Management:    Induction: Intravenous  PONV Risk Score and Plan: Ondansetron, Dexamethasone and Treatment may vary due to age or medical condition  Airway Management Planned: LMA  Additional Equipment:   Intra-op Plan:   Post-operative Plan: Extubation in OR  Informed Consent: I have reviewed the patients History and Physical, chart, labs and discussed the procedure including the  risks, benefits and alternatives for the proposed anesthesia with the patient or authorized representative who has indicated his/her understanding and acceptance.       Plan Discussed with: CRNA  Anesthesia Plan Comments:         Anesthesia Quick Evaluation

## 2020-08-12 NOTE — OR Nursing (Signed)
OK TO RESUME TAKING ASPIRIN TODAY PER DR. Vickki Muff, SECURE-CHAT; ADDED TO D/C INSTRUCTIONS/MED SECTION.

## 2020-08-12 NOTE — Anesthesia Procedure Notes (Signed)
Procedure Name: LMA Insertion Date/Time: 08/12/2020 9:43 AM Performed by: Johnna Acosta, CRNA Pre-anesthesia Checklist: Patient identified, Emergency Drugs available, Suction available, Patient being monitored and Timeout performed Patient Re-evaluated:Patient Re-evaluated prior to induction Oxygen Delivery Method: Circle system utilized Preoxygenation: Pre-oxygenation with 100% oxygen Induction Type: IV induction LMA: LMA inserted LMA Size: 5.0 Tube type: Oral Number of attempts: 1 Airway Equipment and Method: Oral airway Placement Confirmation: positive ETCO2 and breath sounds checked- equal and bilateral Tube secured with: Tape Dental Injury: Teeth and Oropharynx as per pre-operative assessment

## 2020-08-13 ENCOUNTER — Encounter: Payer: Self-pay | Admitting: Podiatry

## 2020-08-15 LAB — SURGICAL PATHOLOGY

## 2020-08-15 NOTE — Progress Notes (Signed)
Pt c/o of being itchy since Saturday states he is using benadryl, he has not taking any pain medications or any medication that is different from his usual meds, not really a rash just itchy. Advised him to let surgeons office know and if it should become worse.

## 2020-08-17 LAB — AEROBIC/ANAEROBIC CULTURE W GRAM STAIN (SURGICAL/DEEP WOUND)

## 2020-08-18 DIAGNOSIS — Z9889 Other specified postprocedural states: Secondary | ICD-10-CM | POA: Diagnosis not present

## 2020-08-18 DIAGNOSIS — L97512 Non-pressure chronic ulcer of other part of right foot with fat layer exposed: Secondary | ICD-10-CM | POA: Diagnosis not present

## 2020-09-15 DIAGNOSIS — E782 Mixed hyperlipidemia: Secondary | ICD-10-CM | POA: Diagnosis not present

## 2020-09-15 DIAGNOSIS — E11610 Type 2 diabetes mellitus with diabetic neuropathic arthropathy: Secondary | ICD-10-CM | POA: Diagnosis not present

## 2020-09-15 DIAGNOSIS — E1142 Type 2 diabetes mellitus with diabetic polyneuropathy: Secondary | ICD-10-CM | POA: Diagnosis not present

## 2020-09-15 DIAGNOSIS — E1140 Type 2 diabetes mellitus with diabetic neuropathy, unspecified: Secondary | ICD-10-CM | POA: Diagnosis not present

## 2020-09-15 DIAGNOSIS — Z9889 Other specified postprocedural states: Secondary | ICD-10-CM | POA: Diagnosis not present

## 2020-09-15 DIAGNOSIS — I10 Essential (primary) hypertension: Secondary | ICD-10-CM | POA: Diagnosis not present

## 2020-09-22 DIAGNOSIS — N1831 Chronic kidney disease, stage 3a: Secondary | ICD-10-CM | POA: Diagnosis not present

## 2020-09-22 DIAGNOSIS — I10 Essential (primary) hypertension: Secondary | ICD-10-CM | POA: Diagnosis not present

## 2020-09-22 DIAGNOSIS — E1140 Type 2 diabetes mellitus with diabetic neuropathy, unspecified: Secondary | ICD-10-CM | POA: Diagnosis not present

## 2020-09-22 DIAGNOSIS — Z0000 Encounter for general adult medical examination without abnormal findings: Secondary | ICD-10-CM | POA: Diagnosis not present

## 2020-09-22 DIAGNOSIS — E782 Mixed hyperlipidemia: Secondary | ICD-10-CM | POA: Diagnosis not present

## 2020-10-27 DIAGNOSIS — E11610 Type 2 diabetes mellitus with diabetic neuropathic arthropathy: Secondary | ICD-10-CM | POA: Diagnosis not present

## 2020-10-27 DIAGNOSIS — E1142 Type 2 diabetes mellitus with diabetic polyneuropathy: Secondary | ICD-10-CM | POA: Diagnosis not present

## 2020-11-28 DIAGNOSIS — E1142 Type 2 diabetes mellitus with diabetic polyneuropathy: Secondary | ICD-10-CM | POA: Diagnosis not present

## 2020-12-06 DIAGNOSIS — E1142 Type 2 diabetes mellitus with diabetic polyneuropathy: Secondary | ICD-10-CM | POA: Diagnosis not present

## 2020-12-06 DIAGNOSIS — S9031XA Contusion of right foot, initial encounter: Secondary | ICD-10-CM | POA: Diagnosis not present

## 2020-12-14 DIAGNOSIS — L601 Onycholysis: Secondary | ICD-10-CM | POA: Diagnosis not present

## 2020-12-14 DIAGNOSIS — E1142 Type 2 diabetes mellitus with diabetic polyneuropathy: Secondary | ICD-10-CM | POA: Diagnosis not present

## 2021-01-17 DIAGNOSIS — H40013 Open angle with borderline findings, low risk, bilateral: Secondary | ICD-10-CM | POA: Diagnosis not present

## 2021-01-17 DIAGNOSIS — H1045 Other chronic allergic conjunctivitis: Secondary | ICD-10-CM | POA: Diagnosis not present

## 2021-01-17 DIAGNOSIS — E119 Type 2 diabetes mellitus without complications: Secondary | ICD-10-CM | POA: Diagnosis not present

## 2021-01-17 DIAGNOSIS — H02201 Unspecified lagophthalmos right upper eyelid: Secondary | ICD-10-CM | POA: Diagnosis not present

## 2021-01-17 DIAGNOSIS — H5203 Hypermetropia, bilateral: Secondary | ICD-10-CM | POA: Diagnosis not present

## 2021-01-31 DIAGNOSIS — S90421D Blister (nonthermal), right great toe, subsequent encounter: Secondary | ICD-10-CM | POA: Diagnosis not present

## 2021-01-31 DIAGNOSIS — E1142 Type 2 diabetes mellitus with diabetic polyneuropathy: Secondary | ICD-10-CM | POA: Diagnosis not present

## 2021-01-31 DIAGNOSIS — E11610 Type 2 diabetes mellitus with diabetic neuropathic arthropathy: Secondary | ICD-10-CM | POA: Diagnosis not present

## 2021-03-20 DIAGNOSIS — M75102 Unspecified rotator cuff tear or rupture of left shoulder, not specified as traumatic: Secondary | ICD-10-CM | POA: Diagnosis not present

## 2021-03-20 DIAGNOSIS — M25811 Other specified joint disorders, right shoulder: Secondary | ICD-10-CM | POA: Diagnosis not present

## 2021-03-22 DIAGNOSIS — E1140 Type 2 diabetes mellitus with diabetic neuropathy, unspecified: Secondary | ICD-10-CM | POA: Diagnosis not present

## 2021-03-22 DIAGNOSIS — I10 Essential (primary) hypertension: Secondary | ICD-10-CM | POA: Diagnosis not present

## 2021-03-22 DIAGNOSIS — E782 Mixed hyperlipidemia: Secondary | ICD-10-CM | POA: Diagnosis not present

## 2021-04-04 DIAGNOSIS — X32XXXA Exposure to sunlight, initial encounter: Secondary | ICD-10-CM | POA: Diagnosis not present

## 2021-04-04 DIAGNOSIS — L570 Actinic keratosis: Secondary | ICD-10-CM | POA: Diagnosis not present

## 2021-04-04 DIAGNOSIS — L538 Other specified erythematous conditions: Secondary | ICD-10-CM | POA: Diagnosis not present

## 2021-04-04 DIAGNOSIS — L820 Inflamed seborrheic keratosis: Secondary | ICD-10-CM | POA: Diagnosis not present

## 2021-04-05 DIAGNOSIS — I10 Essential (primary) hypertension: Secondary | ICD-10-CM | POA: Diagnosis not present

## 2021-04-05 DIAGNOSIS — E782 Mixed hyperlipidemia: Secondary | ICD-10-CM | POA: Diagnosis not present

## 2021-04-05 DIAGNOSIS — E1140 Type 2 diabetes mellitus with diabetic neuropathy, unspecified: Secondary | ICD-10-CM | POA: Diagnosis not present

## 2021-05-09 DIAGNOSIS — E11610 Type 2 diabetes mellitus with diabetic neuropathic arthropathy: Secondary | ICD-10-CM | POA: Diagnosis not present

## 2021-05-09 DIAGNOSIS — E1142 Type 2 diabetes mellitus with diabetic polyneuropathy: Secondary | ICD-10-CM | POA: Diagnosis not present

## 2021-05-09 DIAGNOSIS — B351 Tinea unguium: Secondary | ICD-10-CM | POA: Diagnosis not present

## 2021-07-19 DIAGNOSIS — E782 Mixed hyperlipidemia: Secondary | ICD-10-CM | POA: Diagnosis not present

## 2021-07-19 DIAGNOSIS — I10 Essential (primary) hypertension: Secondary | ICD-10-CM | POA: Diagnosis not present

## 2021-07-19 DIAGNOSIS — E1140 Type 2 diabetes mellitus with diabetic neuropathy, unspecified: Secondary | ICD-10-CM | POA: Diagnosis not present

## 2021-07-26 DIAGNOSIS — E782 Mixed hyperlipidemia: Secondary | ICD-10-CM | POA: Diagnosis not present

## 2021-07-26 DIAGNOSIS — I10 Essential (primary) hypertension: Secondary | ICD-10-CM | POA: Diagnosis not present

## 2021-07-26 DIAGNOSIS — E1140 Type 2 diabetes mellitus with diabetic neuropathy, unspecified: Secondary | ICD-10-CM | POA: Diagnosis not present

## 2021-07-26 DIAGNOSIS — Z8739 Personal history of other diseases of the musculoskeletal system and connective tissue: Secondary | ICD-10-CM | POA: Diagnosis not present

## 2021-10-30 DIAGNOSIS — D0422 Carcinoma in situ of skin of left ear and external auricular canal: Secondary | ICD-10-CM | POA: Diagnosis not present

## 2021-10-30 DIAGNOSIS — D485 Neoplasm of uncertain behavior of skin: Secondary | ICD-10-CM | POA: Diagnosis not present

## 2021-11-11 DIAGNOSIS — J019 Acute sinusitis, unspecified: Secondary | ICD-10-CM | POA: Diagnosis not present

## 2021-11-11 DIAGNOSIS — J209 Acute bronchitis, unspecified: Secondary | ICD-10-CM | POA: Diagnosis not present

## 2021-11-11 DIAGNOSIS — B9689 Other specified bacterial agents as the cause of diseases classified elsewhere: Secondary | ICD-10-CM | POA: Diagnosis not present

## 2021-11-18 DIAGNOSIS — J019 Acute sinusitis, unspecified: Secondary | ICD-10-CM | POA: Diagnosis not present

## 2021-11-18 DIAGNOSIS — B9689 Other specified bacterial agents as the cause of diseases classified elsewhere: Secondary | ICD-10-CM | POA: Diagnosis not present

## 2021-11-18 DIAGNOSIS — J209 Acute bronchitis, unspecified: Secondary | ICD-10-CM | POA: Diagnosis not present

## 2021-11-23 DIAGNOSIS — L905 Scar conditions and fibrosis of skin: Secondary | ICD-10-CM | POA: Diagnosis not present

## 2021-11-23 DIAGNOSIS — D044 Carcinoma in situ of skin of scalp and neck: Secondary | ICD-10-CM | POA: Diagnosis not present

## 2021-11-28 DIAGNOSIS — I10 Essential (primary) hypertension: Secondary | ICD-10-CM | POA: Diagnosis not present

## 2021-11-28 DIAGNOSIS — E11610 Type 2 diabetes mellitus with diabetic neuropathic arthropathy: Secondary | ICD-10-CM | POA: Diagnosis not present

## 2021-11-28 DIAGNOSIS — E782 Mixed hyperlipidemia: Secondary | ICD-10-CM | POA: Diagnosis not present

## 2021-11-28 DIAGNOSIS — E1142 Type 2 diabetes mellitus with diabetic polyneuropathy: Secondary | ICD-10-CM | POA: Diagnosis not present

## 2021-11-28 DIAGNOSIS — E1140 Type 2 diabetes mellitus with diabetic neuropathy, unspecified: Secondary | ICD-10-CM | POA: Diagnosis not present

## 2021-11-28 DIAGNOSIS — B351 Tinea unguium: Secondary | ICD-10-CM | POA: Diagnosis not present

## 2021-12-05 DIAGNOSIS — E782 Mixed hyperlipidemia: Secondary | ICD-10-CM | POA: Diagnosis not present

## 2021-12-05 DIAGNOSIS — I10 Essential (primary) hypertension: Secondary | ICD-10-CM | POA: Diagnosis not present

## 2021-12-05 DIAGNOSIS — Z0000 Encounter for general adult medical examination without abnormal findings: Secondary | ICD-10-CM | POA: Diagnosis not present

## 2021-12-05 DIAGNOSIS — E1140 Type 2 diabetes mellitus with diabetic neuropathy, unspecified: Secondary | ICD-10-CM | POA: Diagnosis not present

## 2021-12-05 DIAGNOSIS — N1831 Chronic kidney disease, stage 3a: Secondary | ICD-10-CM | POA: Diagnosis not present

## 2022-01-24 DIAGNOSIS — H1045 Other chronic allergic conjunctivitis: Secondary | ICD-10-CM | POA: Diagnosis not present

## 2022-01-24 DIAGNOSIS — H02201 Unspecified lagophthalmos right upper eyelid: Secondary | ICD-10-CM | POA: Diagnosis not present

## 2022-01-24 DIAGNOSIS — H40013 Open angle with borderline findings, low risk, bilateral: Secondary | ICD-10-CM | POA: Diagnosis not present

## 2022-01-24 DIAGNOSIS — H5203 Hypermetropia, bilateral: Secondary | ICD-10-CM | POA: Diagnosis not present

## 2022-01-24 DIAGNOSIS — E119 Type 2 diabetes mellitus without complications: Secondary | ICD-10-CM | POA: Diagnosis not present

## 2022-02-26 DIAGNOSIS — E782 Mixed hyperlipidemia: Secondary | ICD-10-CM | POA: Diagnosis not present

## 2022-02-26 DIAGNOSIS — E1140 Type 2 diabetes mellitus with diabetic neuropathy, unspecified: Secondary | ICD-10-CM | POA: Diagnosis not present

## 2022-02-26 DIAGNOSIS — I10 Essential (primary) hypertension: Secondary | ICD-10-CM | POA: Diagnosis not present

## 2022-03-07 DIAGNOSIS — I10 Essential (primary) hypertension: Secondary | ICD-10-CM | POA: Diagnosis not present

## 2022-03-07 DIAGNOSIS — E782 Mixed hyperlipidemia: Secondary | ICD-10-CM | POA: Diagnosis not present

## 2022-03-07 DIAGNOSIS — E1140 Type 2 diabetes mellitus with diabetic neuropathy, unspecified: Secondary | ICD-10-CM | POA: Diagnosis not present

## 2022-04-11 DIAGNOSIS — Z85828 Personal history of other malignant neoplasm of skin: Secondary | ICD-10-CM | POA: Diagnosis not present

## 2022-04-11 DIAGNOSIS — C44519 Basal cell carcinoma of skin of other part of trunk: Secondary | ICD-10-CM | POA: Diagnosis not present

## 2022-04-11 DIAGNOSIS — C44612 Basal cell carcinoma of skin of right upper limb, including shoulder: Secondary | ICD-10-CM | POA: Diagnosis not present

## 2022-04-11 DIAGNOSIS — D485 Neoplasm of uncertain behavior of skin: Secondary | ICD-10-CM | POA: Diagnosis not present

## 2022-04-11 DIAGNOSIS — L570 Actinic keratosis: Secondary | ICD-10-CM | POA: Diagnosis not present

## 2022-04-11 DIAGNOSIS — Z08 Encounter for follow-up examination after completed treatment for malignant neoplasm: Secondary | ICD-10-CM | POA: Diagnosis not present

## 2022-04-11 DIAGNOSIS — X32XXXA Exposure to sunlight, initial encounter: Secondary | ICD-10-CM | POA: Diagnosis not present

## 2022-04-11 DIAGNOSIS — L821 Other seborrheic keratosis: Secondary | ICD-10-CM | POA: Diagnosis not present

## 2022-04-18 DIAGNOSIS — C44612 Basal cell carcinoma of skin of right upper limb, including shoulder: Secondary | ICD-10-CM | POA: Diagnosis not present

## 2022-05-17 DIAGNOSIS — C44519 Basal cell carcinoma of skin of other part of trunk: Secondary | ICD-10-CM | POA: Diagnosis not present

## 2022-06-21 DIAGNOSIS — G3184 Mild cognitive impairment, so stated: Secondary | ICD-10-CM | POA: Diagnosis not present

## 2022-06-21 DIAGNOSIS — F1090 Alcohol use, unspecified, uncomplicated: Secondary | ICD-10-CM | POA: Diagnosis not present

## 2022-06-21 DIAGNOSIS — R7309 Other abnormal glucose: Secondary | ICD-10-CM | POA: Diagnosis not present

## 2022-06-21 DIAGNOSIS — E1140 Type 2 diabetes mellitus with diabetic neuropathy, unspecified: Secondary | ICD-10-CM | POA: Diagnosis not present

## 2022-06-21 DIAGNOSIS — G629 Polyneuropathy, unspecified: Secondary | ICD-10-CM | POA: Diagnosis not present

## 2022-06-21 DIAGNOSIS — D3610 Benign neoplasm of peripheral nerves and autonomic nervous system, unspecified: Secondary | ICD-10-CM | POA: Diagnosis not present

## 2022-07-23 DIAGNOSIS — G629 Polyneuropathy, unspecified: Secondary | ICD-10-CM | POA: Diagnosis not present

## 2022-08-07 DIAGNOSIS — E1142 Type 2 diabetes mellitus with diabetic polyneuropathy: Secondary | ICD-10-CM | POA: Diagnosis not present

## 2022-08-07 DIAGNOSIS — B351 Tinea unguium: Secondary | ICD-10-CM | POA: Diagnosis not present

## 2022-08-07 DIAGNOSIS — E11610 Type 2 diabetes mellitus with diabetic neuropathic arthropathy: Secondary | ICD-10-CM | POA: Diagnosis not present

## 2022-08-07 DIAGNOSIS — L97511 Non-pressure chronic ulcer of other part of right foot limited to breakdown of skin: Secondary | ICD-10-CM | POA: Diagnosis not present

## 2022-08-28 DIAGNOSIS — E1142 Type 2 diabetes mellitus with diabetic polyneuropathy: Secondary | ICD-10-CM | POA: Diagnosis not present

## 2022-08-28 DIAGNOSIS — E11610 Type 2 diabetes mellitus with diabetic neuropathic arthropathy: Secondary | ICD-10-CM | POA: Diagnosis not present

## 2022-08-28 DIAGNOSIS — M2042 Other hammer toe(s) (acquired), left foot: Secondary | ICD-10-CM | POA: Diagnosis not present

## 2022-09-05 DIAGNOSIS — E782 Mixed hyperlipidemia: Secondary | ICD-10-CM | POA: Diagnosis not present

## 2022-09-05 DIAGNOSIS — E1140 Type 2 diabetes mellitus with diabetic neuropathy, unspecified: Secondary | ICD-10-CM | POA: Diagnosis not present

## 2022-09-12 DIAGNOSIS — I10 Essential (primary) hypertension: Secondary | ICD-10-CM | POA: Diagnosis not present

## 2022-09-12 DIAGNOSIS — E782 Mixed hyperlipidemia: Secondary | ICD-10-CM | POA: Diagnosis not present

## 2022-09-12 DIAGNOSIS — E1140 Type 2 diabetes mellitus with diabetic neuropathy, unspecified: Secondary | ICD-10-CM | POA: Diagnosis not present

## 2022-09-12 DIAGNOSIS — Z8739 Personal history of other diseases of the musculoskeletal system and connective tissue: Secondary | ICD-10-CM | POA: Diagnosis not present

## 2022-09-20 DIAGNOSIS — F1090 Alcohol use, unspecified, uncomplicated: Secondary | ICD-10-CM | POA: Diagnosis not present

## 2022-09-20 DIAGNOSIS — G629 Polyneuropathy, unspecified: Secondary | ICD-10-CM | POA: Diagnosis not present

## 2022-10-09 DIAGNOSIS — B351 Tinea unguium: Secondary | ICD-10-CM | POA: Diagnosis not present

## 2022-10-09 DIAGNOSIS — E1142 Type 2 diabetes mellitus with diabetic polyneuropathy: Secondary | ICD-10-CM | POA: Diagnosis not present

## 2022-10-09 DIAGNOSIS — E11610 Type 2 diabetes mellitus with diabetic neuropathic arthropathy: Secondary | ICD-10-CM | POA: Diagnosis not present

## 2022-10-17 DIAGNOSIS — Z85828 Personal history of other malignant neoplasm of skin: Secondary | ICD-10-CM | POA: Diagnosis not present

## 2022-10-17 DIAGNOSIS — D0461 Carcinoma in situ of skin of right upper limb, including shoulder: Secondary | ICD-10-CM | POA: Diagnosis not present

## 2022-10-17 DIAGNOSIS — Z08 Encounter for follow-up examination after completed treatment for malignant neoplasm: Secondary | ICD-10-CM | POA: Diagnosis not present

## 2022-10-17 DIAGNOSIS — D485 Neoplasm of uncertain behavior of skin: Secondary | ICD-10-CM | POA: Diagnosis not present

## 2022-10-17 DIAGNOSIS — L570 Actinic keratosis: Secondary | ICD-10-CM | POA: Diagnosis not present

## 2022-10-17 DIAGNOSIS — L578 Other skin changes due to chronic exposure to nonionizing radiation: Secondary | ICD-10-CM | POA: Diagnosis not present

## 2022-10-17 DIAGNOSIS — D0462 Carcinoma in situ of skin of left upper limb, including shoulder: Secondary | ICD-10-CM | POA: Diagnosis not present

## 2022-10-17 DIAGNOSIS — D225 Melanocytic nevi of trunk: Secondary | ICD-10-CM | POA: Diagnosis not present

## 2022-10-17 DIAGNOSIS — D2262 Melanocytic nevi of left upper limb, including shoulder: Secondary | ICD-10-CM | POA: Diagnosis not present

## 2022-10-17 DIAGNOSIS — X32XXXA Exposure to sunlight, initial encounter: Secondary | ICD-10-CM | POA: Diagnosis not present

## 2022-10-29 DIAGNOSIS — D0461 Carcinoma in situ of skin of right upper limb, including shoulder: Secondary | ICD-10-CM | POA: Diagnosis not present

## 2022-10-29 DIAGNOSIS — D0462 Carcinoma in situ of skin of left upper limb, including shoulder: Secondary | ICD-10-CM | POA: Diagnosis not present

## 2022-11-13 ENCOUNTER — Ambulatory Visit: Payer: PPO | Attending: Neurology | Admitting: Physical Therapy

## 2022-11-13 ENCOUNTER — Encounter: Payer: Self-pay | Admitting: Physical Therapy

## 2022-11-13 DIAGNOSIS — R2689 Other abnormalities of gait and mobility: Secondary | ICD-10-CM | POA: Insufficient documentation

## 2022-11-13 DIAGNOSIS — R269 Unspecified abnormalities of gait and mobility: Secondary | ICD-10-CM | POA: Diagnosis not present

## 2022-11-13 DIAGNOSIS — M6281 Muscle weakness (generalized): Secondary | ICD-10-CM | POA: Diagnosis not present

## 2022-11-13 NOTE — Therapy (Cosign Needed)
OUTPATIENT PHYSICAL THERAPY LOWER EXTREMITY EVALUATION   Patient Name: Taylor Ross MRN: [id number] DOB:12/05/1945, 77 y.o., male Today's Date: 11/13/2022  END OF SESSION:  PT End of Session - 11/13/22 1025     Visit Number 1    Number of Visits 12    Date for PT Re-Evaluation 12/25/22    PT Start Time 0900    PT Stop Time 0951    PT Time Calculation (min) 51 min    Equipment Utilized During Treatment Gait belt    Activity Tolerance Patient tolerated treatment well    Behavior During Therapy WFL for tasks assessed/performed             Past Medical History:  Diagnosis Date   Allergic rhinitis    Bacterial meningitis    Diabetes mellitus without complication (HCC)    Diabetic foot ulcer associated with type 2 diabetes mellitus (HCC)    GERD (gastroesophageal reflux disease)    Gout    Hyperlipidemia    Hypertension    Neuropathic diabetic ulcer of foot (HCC)    Schwannoma    Past Surgical History:  Procedure Laterality Date   BONE EXOSTOSIS EXCISION Right 08/12/2020   Procedure: EXOSTOSIS EXCISION- SADDLEBONE X2- RIGHT;  Surgeon: Gwyneth Revels, DPM;  Location: [facility] ORS;  Service: Podiatry;  Laterality: Right;   BRAIN SURGERY  2006 x 3; 2010 x 1   Benign Tumor Removal [facility])   COLON SURGERY  2008   Dr. Katrinka Blazing [facility])   COLONOSCOPY WITH PROPOFOL N/A 11/08/2015   Procedure: COLONOSCOPY WITH PROPOFOL;  Surgeon: Christena Deem, MD;  [facility] ENDOSCOPY;  Service: Endoscopy;  Laterality: N/A;   COLONOSCOPY WITH PROPOFOL N/A 11/09/2015   Procedure: COLONOSCOPY WITH PROPOFOL;  Surgeon: Christena Deem, MD;  Location: [facility] ENDOSCOPY;  Service: Endoscopy;  Laterality: N/A;   COLONOSCOPY WITH PROPOFOL N/A 10/21/2018   Procedure: COLONOSCOPY WITH PROPOFOL;  Surgeon: Christena Deem, MD;  Location: [facility] ENDOSCOPY;  Service: Endoscopy;  Laterality: N/A;   FLEXIBLE BRONCHOSCOPY N/A 10/04/2015   Procedure: FLEXIBLE BRONCHOSCOPY;  Surgeon: Merwyn Katos, MD;   Location: [facility] ORS;  Service: Pulmonary;  Laterality: N/A;   Patient Active Problem List   Diagnosis Date Noted   Controlled type 2 diabetes mellitus without complication (HCC) 10/07/2015   Neurilemmoma 10/07/2015   History of colon polyps 10/07/2015   Alimentary obesity 10/07/2015   Combined fat and carbohydrate induced hyperlipemia 10/07/2015   H/O: gout 10/07/2015   ED (erectile dysfunction) of organic origin 10/07/2015   Allergic rhinitis 10/07/2015   Chronic kidney disease (CKD), stage III (moderate) (HCC) 03/23/2015   H/O meningitis 09/08/2006    PCP: Marisue Ivan, MD  REFERRING PROVIDER: Lonell Face, MD  REFERRING DIAG: Balance problem  THERAPY DIAG:  Balance problem  Muscle weakness (generalized)  Gait difficulty  Rationale for Evaluation and Treatment: Rehabilitation  ONSET DATE: 08/12/2020  SUBJECTIVE:   SUBJECTIVE STATEMENT: Pt reports initial onset of balance issues in 2006 when he had 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 He reports his balance has continued to get worse over time, particularly with immediate standing and gait initiation. Pt denies any dizziness/lightheadedness, syncope; denies falls in the last 6 months although has had numerous "near falls." Pt also reports low back pain (R>L) that typically worsens towards the end of the day.  PERTINENT HISTORY: R vestibular schwannoma, peripheral neuropathy, alcohol use, pre-diabetes PAIN:  Are you having pain? Yes:  NPRS scale: 3/10 Pain location: R low back Pain description: ache Aggravating factors: activity Relieving factors: Rest  PRECAUTIONS: Fall  RED FLAGS: None   WEIGHT BEARING RESTRICTIONS: No  FALLS:  Has patient fallen in last 6 months? No  LIVING ENVIRONMENT: Lives with: lives with their spouse Lives in: House/apartment Stairs: Yes: External: 4 steps; bilateral but cannot reach both Has following  equipment at home: Single point cane  OCCUPATION: Retired  PLOF: Independent  PATIENT GOALS: Get back to walking  NEXT MD VISIT: TBD  OBJECTIVE:   DIAGNOSTIC FINDINGS: N/A  PATIENT SURVEYS:  FOTO TBD next visit  COGNITION: Overall cognitive status: Within functional limits for tasks assessed     SENSATION: Light touch: Impaired  (Numbness bilaterally at level of malleoli and distally)   POSTURE: rounded shoulders   LOWER EXTREMITY ROM:  Active ROM Right eval Left eval  Hip flexion [facility]  Hip extension    Hip abduction [facility]  Hip adduction    Hip internal rotation [facility] WFL  Hip external rotation [facility]  Knee flexion    Knee extension [facility] WFL  Ankle dorsiflexion [facility] WFL  Ankle plantarflexion    Ankle inversion    Ankle eversion     (Blank rows = not tested)  LOWER EXTREMITY MMT:  MMT Right eval Left eval  Hip flexion 4 4  Hip extension    Hip abduction 4 4  Hip adduction    Hip internal rotation 5 5  Hip external rotation 4+ 4+  Knee flexion 4 4-  Knee extension 5 5  Ankle dorsiflexion 4 4  Ankle plantarflexion    Ankle inversion    Ankle eversion     (Blank rows = not tested)  FUNCTIONAL TESTS:  5 times sit to stand: 15.8 seconds Berg Balance Scale: 39/56 (increased fall risk)  GAIT: Distance walked: 200 feet in clinic/outside Assistive device utilized: None Level of assistance: Complete Independence Comments: Reciprocal gait pattern, slow gait speed, decreased step length bilaterally, narrow BoS   TODAY'S TREATMENT:                                                                                                                              DATE: 11/13/2022   Evaluation/ See HEP   PATIENT EDUCATION:  Education details: Use of SPC with transfers, ambulation; initial HEP Person educated: Patient Education method: Medical illustrator Education comprehension: verbalized understanding and returned demonstration  HOME  EXERCISE PROGRAM: Romberg Stance with Unilateral Counter Support: 3 x 30 second holds, 2x/day, 7x/week  Standing Tandem Balance with Counter Support: 3 x 30 second holds, 2x/day, 7x/week  Sit to Stand Without Arm Support: 1 x 10, 2x/day, 7x/week  ASSESSMENT:  CLINICAL IMPRESSION: Patient is a 77 y.o. male who was seen today for physical therapy evaluation and treatment for balance problems. Pt's impairments include decreased LE muscle strength, decreased light touch to bilateral feet and ankles secondary to peripheral neuropathy, decreased  unilateral stability. Pt's functional limitations include decreased walking tolerance. Pt is at an increased risk for falls based on 5XSTS and Berg Balance test scores. Pt will benefit from skilled PT services to address the above impairments and decrease fall risk.  OBJECTIVE IMPAIRMENTS: decreased activity tolerance, decreased balance, decreased knowledge of use of DME, difficulty walking, decreased strength, decreased safety awareness, and impaired sensation.   ACTIVITY LIMITATIONS: standing and squatting  PARTICIPATION LIMITATIONS: community activity  PERSONAL FACTORS: Age, Time since onset of injury/illness/exacerbation, and 3+ comorbidities: Vestibular Schwannoma, diabetic charcot foot, peripheral neuropathy, LBP  are also affecting patient's functional outcome.   REHAB POTENTIAL: Fair to Good  CLINICAL DECISION MAKING: Stable/uncomplicated  EVALUATION COMPLEXITY: Low   GOALS:   SHORT TERM GOALS: Target date: 12/04/2022 Pt will complete FOTO next visit. Baseline: N/A Goal status: INITIAL  2.  Pt will be independent and compliant with HEP in order to improve bilateral hip strength and static balance. Baseline: See above for MMT scores/balance scores Goal status: INITIAL    LONG TERM GOALS: Target date: 12/25/2022  Pt will decrease 5TSTS by at least 3 seconds in order to demonstrate decreased risk for falls. Baseline: 15.8 seconds   Goal status: INITIAL  2.  Pt will improve BERG to at least 45/56 to demonstrate improved static and dynamic balance and decreased risk for falls.  Baseline: 39/56 Goal status: INITIAL  3.  Pt will improve LE MMT scores to at least 4+/5 bilaterally to demonstrate improved LE strength. Baseline: See above Goal status: INITIAL  4.  Pt will increase FOTO score to ___ to demonstrate improved balance and performance of ADL's/IADL's. Baseline: TBD Goal status: INITIAL    PLAN:  PT FREQUENCY: 2x/week  PT DURATION: 6 weeks  PLANNED INTERVENTIONS: Therapeutic exercises, Therapeutic activity, Neuromuscular re-education, Balance training, Gait training, Patient/Family education, Self Care, Cryotherapy, and Moist heat  PLAN FOR NEXT SESSION: COMPLETE FOTO, Introduce LE/core strengthening exercises, progress static/dynamic balance exercises    , Student-PT 11/13/2022, 6:09 PM

## 2022-11-15 ENCOUNTER — Encounter: Payer: Self-pay | Admitting: Physical Therapy

## 2022-11-15 ENCOUNTER — Ambulatory Visit: Payer: PPO | Admitting: Physical Therapy

## 2022-11-15 DIAGNOSIS — R269 Unspecified abnormalities of gait and mobility: Secondary | ICD-10-CM

## 2022-11-15 DIAGNOSIS — R2689 Other abnormalities of gait and mobility: Secondary | ICD-10-CM | POA: Diagnosis not present

## 2022-11-15 DIAGNOSIS — M6281 Muscle weakness (generalized): Secondary | ICD-10-CM

## 2022-11-15 NOTE — Therapy (Signed)
OUTPATIENT PHYSICAL THERAPY LOWER EXTREMITY TREATMENT   Patient Name: Taylor Ross MRN: [id number] DOB:Oct 22, 1945, 77 y.o., male Today's Date: 11/15/2022  END OF SESSION:  PT End of Session - 11/15/22 0813     Visit Number 2    Number of Visits 12    Date for PT Re-Evaluation 12/25/22    PT Start Time 0811    PT Stop Time 0858    PT Time Calculation (min) 47 min    Equipment Utilized During Treatment Gait belt    Activity Tolerance Patient tolerated treatment well    Behavior During Therapy WFL for tasks assessed/performed             Past Medical History:  Diagnosis Date   Allergic rhinitis    Bacterial meningitis    Diabetes mellitus without complication (HCC)    Diabetic foot ulcer associated with type 2 diabetes mellitus (HCC)    GERD (gastroesophageal reflux disease)    Gout    Hyperlipidemia    Hypertension    Neuropathic diabetic ulcer of foot (HCC)    Schwannoma    Past Surgical History:  Procedure Laterality Date   BONE EXOSTOSIS EXCISION Right 08/12/2020   Procedure: EXOSTOSIS EXCISION- SADDLEBONE X2- RIGHT;  Surgeon: Gwyneth Revels, DPM;  Location: [facility] ORS;  Service: Podiatry;  Laterality: Right;   BRAIN SURGERY  2006 x 3; 2010 x 1   Benign Tumor Removal [facility])   COLON SURGERY  2008   Dr. Katrinka Blazing [facility])   COLONOSCOPY WITH PROPOFOL N/A 11/08/2015   Procedure: COLONOSCOPY WITH PROPOFOL;  Surgeon: Christena Deem, MD;  Location[facility] ENDOSCOPY;  Service: Endoscopy;  Laterality: N/A;   COLONOSCOPY WITH PROPOFOL N/A 11/09/2015   Procedure: COLONOSCOPY WITH PROPOFOL;  Surgeon: Christena Deem, MD;  Location: [facility] ENDOSCOPY;  Service: Endoscopy;  Laterality: N/A;   COLONOSCOPY WITH PROPOFOL N/A 10/21/2018   Procedure: COLONOSCOPY WITH PROPOFOL;  Surgeon: Christena Deem, MD;  Location[facility] ENDOSCOPY;  Service: Endoscopy;  Laterality: N/A;   FLEXIBLE BRONCHOSCOPY N/A 10/04/2015   Procedure: FLEXIBLE BRONCHOSCOPY;  Surgeon: Merwyn Katos, MD;   Location: [facility] ORS;  Service: Pulmonary;  Laterality: N/A;   Patient Active Problem List   Diagnosis Date Noted   Controlled type 2 diabetes mellitus without complication (HCC) 10/07/2015   Neurilemmoma 10/07/2015   History of colon polyps 10/07/2015   Alimentary obesity 10/07/2015   Combined fat and carbohydrate induced hyperlipemia 10/07/2015   H/O: gout 10/07/2015   ED (erectile dysfunction) of organic origin 10/07/2015   Allergic rhinitis 10/07/2015   Chronic kidney disease (CKD), stage III (moderate) (HCC) 03/23/2015   H/O meningitis 09/08/2006    PCP: Marisue Ivan, MD  REFERRING PROVIDER: Lonell Face, MD  REFERRING DIAG: Balance problem  THERAPY DIAG:  Balance problems  Muscle weakness (generalized)  Gait difficulty  Rationale for Evaluation and Treatment: Rehabilitation  ONSET DATE: 08/12/2020  SUBJECTIVE:   SUBJECTIVE STATEMENT: Pt reports initial onset of balance issues in 2006 when he had 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 He reports his balance has continued to get worse over time, particularly with immediate standing and gait initiation. Pt denies any dizziness/lightheadedness, syncope; denies falls in the last 6 months although has had numerous "near falls." Pt also reports low back pain (R>L) that typically worsens towards the end of the day.  PERTINENT HISTORY: R vestibular schwannoma, peripheral neuropathy, alcohol use, pre-diabetes PAIN:  Are you having pain? Yes:  NPRS scale: 3/10 Pain location: R low back Pain description: ache Aggravating factors: activity Relieving factors: Rest  PRECAUTIONS: Fall  RED FLAGS: None   WEIGHT BEARING RESTRICTIONS: No  FALLS:  Has patient fallen in last 6 months? No  LIVING ENVIRONMENT: Lives with: lives with their spouse Lives in: House/apartment Stairs: Yes: External: 4 steps; bilateral but cannot reach both Has following  equipment at home: Single point cane  OCCUPATION: Retired  PLOF: Independent  PATIENT GOALS: Get back to walking  NEXT MD VISIT: TBD  OBJECTIVE:   DIAGNOSTIC FINDINGS: N/A  PATIENT SURVEYS:  FOTO TBD next visit  COGNITION: Overall cognitive status: Within functional limits for tasks assessed     SENSATION: Light touch: Impaired  (Numbness bilaterally at level of malleoli and distally)   POSTURE: rounded shoulders   LOWER EXTREMITY ROM:  Active ROM Right eval Left eval  Hip flexion [facility]  Hip extension    Hip abduction [facility]  Hip adduction    Hip internal rotation [facility] WFL  Hip external rotation [facility]  Knee flexion    Knee extension [facility] WFL  Ankle dorsiflexion [facility] WFL  Ankle plantarflexion    Ankle inversion    Ankle eversion     (Blank rows = not tested)  LOWER EXTREMITY MMT:  MMT Right eval Left eval  Hip flexion 4 4  Hip extension    Hip abduction 4 4  Hip adduction    Hip internal rotation 5 5  Hip external rotation 4+ 4+  Knee flexion 4 4-  Knee extension 5 5  Ankle dorsiflexion 4 4  Ankle plantarflexion    Ankle inversion    Ankle eversion     (Blank rows = not tested)  FUNCTIONAL TESTS:  5 times sit to stand: 15.8 seconds Berg Balance Scale: 39/56 (increased fall risk)  GAIT: Distance walked: 200 feet in clinic/outside Assistive device utilized: None Level of assistance: Complete Independence Comments: Reciprocal gait pattern, slow gait speed, decreased step length bilaterally, narrow BoS   FOTO: initial 46/ goal 53  TODAY'S TREATMENT:                                                                                                                              DATE: 11/13/2022   Subjective:  Pt reports doing well today. He says his exercises have been going well and he feels like they are still a good challenge.  Neuro.mm.:  Romberg Stance on Foam pad in //-bars: 4 x 30 second holds, adding head turns (L/R + Up/down)-  CGA  Tandem stance while shooting mini-basketball: x2 minutes each foot forward - R shoulder irritation after longer period of time.  CGA for safety.  Walking in // bars while stepping over hurdles: 4 laps fwd, 4 laps sideways  Alternating foot taps onto 6 inch step: 2x10 each foot  There.ex.:  Nustep x 10 mins to improve cardiovascular endurance and LE strength: Level 4, seat  11 with B UE/LE.    Fwd Step-ups with unilateral UE support: 2 x 10 each leg  Added supine bridging/ step ups to HEP   PATIENT EDUCATION:  Education details: Use of SPC with transfers, ambulation; initial HEP Person educated: Patient Education method: Medical illustrator Education comprehension: verbalized understanding and returned demonstration  HOME EXERCISE PROGRAM: Access Code: [email or web address] URL: [email or web address] Date: 11/13/2022 Prepared by: Dorene Grebe  Exercises - Sit to Stand Without Arm Support  - 2 x daily - 7 x weekly - 1 sets - 10 reps - Standing Tandem Balance with Counter Support  - 2 x daily - 7 x weekly - 1 sets - 10 reps - Narrow Stance with Unilateral Counter Support  - 2 x daily - 7 x weekly - 1 sets - 10 reps  ASSESSMENT:  CLINICAL IMPRESSION: Pt presents to PT with reports of compliance to HEP since last session. Today's session consisted of static and dynamic balance exercises to try and improve unilateral and bilateral stability. Pt required contact guard assist throughout all exercises for safety; occasional instances of mild losses of balance, particularly with dynamic balance activities, however pt was able to self-correct each time. LE strengthening exercises also included in today's session, which pt tolerated well with only reports of cardiovascular and muscular fatigue. Pt will continue to benefit from skilled PT services to improve functional independence and return to PLOF.  OBJECTIVE IMPAIRMENTS: decreased activity tolerance, decreased balance,  decreased knowledge of use of DME, difficulty walking, decreased strength, decreased safety awareness, and impaired sensation.   ACTIVITY LIMITATIONS: standing and squatting  PARTICIPATION LIMITATIONS: community activity  PERSONAL FACTORS: Age, Time since onset of injury/illness/exacerbation, and 3+ comorbidities: Vestibular Schwannoma, diabetic charcot foot, peripheral neuropathy, LBP  are also affecting patient's functional outcome.   REHAB POTENTIAL: Fair to Good  CLINICAL DECISION MAKING: Stable/uncomplicated  EVALUATION COMPLEXITY: Low   GOALS:   SHORT TERM GOALS: Target date: 12/04/2022 Pt will complete FOTO next visit. Baseline: N/A.  8/8: initial 46/ goal 53 Goal status: Goal met  2.  Pt will be independent and compliant with HEP in order to improve bilateral hip strength and static balance. Baseline: See above for MMT scores/balance scores Goal status: INITIAL    LONG TERM GOALS: Target date: 12/25/2022  Pt will decrease 5TSTS by at least 3 seconds in order to demonstrate decreased risk for falls. Baseline: 15.8 seconds  Goal status: INITIAL  2.  Pt will improve BERG to at least 45/56 to demonstrate improved static and dynamic balance and decreased risk for falls.  Baseline: 39/56 Goal status: INITIAL  3.  Pt will improve LE MMT scores to at least 4+/5 bilaterally to demonstrate improved LE strength. Baseline: See above Goal status: INITIAL  4.  Pt will increase FOTO score to goal to demonstrate improved balance and performance of ADL's/IADL's. Baseline: TBD Goal status: INITIAL   PLAN:  PT FREQUENCY: 2x/week  PT DURATION: 6 weeks  PLANNED INTERVENTIONS: Therapeutic exercises, Therapeutic activity, Neuromuscular re-education, Balance training, Gait training, Patient/Family education, Self Care, Cryotherapy, and Moist heat  PLAN FOR NEXT SESSION: Progress LE/core strengthening exercises, progress static/dynamic balance exercises, trial resisted gait +  more FGA tasks  Cammie Mcgee, PT, DPT # [phone] Cena Benton, Student-PT 11/15/2022, 10:11 AM

## 2022-11-20 ENCOUNTER — Ambulatory Visit: Payer: PPO | Admitting: Physical Therapy

## 2022-11-20 ENCOUNTER — Encounter: Payer: Self-pay | Admitting: Physical Therapy

## 2022-11-20 DIAGNOSIS — R269 Unspecified abnormalities of gait and mobility: Secondary | ICD-10-CM

## 2022-11-20 DIAGNOSIS — M6281 Muscle weakness (generalized): Secondary | ICD-10-CM

## 2022-11-20 DIAGNOSIS — R2689 Other abnormalities of gait and mobility: Secondary | ICD-10-CM | POA: Diagnosis not present

## 2022-11-20 NOTE — Therapy (Signed)
OUTPATIENT PHYSICAL THERAPY LOWER EXTREMITY TREATMENT   Patient Name: Taylor Ross MRN: [id number] DOB:11/15/1945, 77 y.o., male Today's Date: 11/20/2022  END OF SESSION:  PT End of Session - 11/20/22 1042     Visit Number 3    Number of Visits 12    Date for PT Re-Evaluation 12/25/22    PT Start Time 0900    PT Stop Time 0948    PT Time Calculation (min) 48 min    Equipment Utilized During Treatment Gait belt    Activity Tolerance Patient tolerated treatment well    Behavior During Therapy WFL for tasks assessed/performed              Past Medical History:  Diagnosis Date   Allergic rhinitis    Bacterial meningitis    Diabetes mellitus without complication (HCC)    Diabetic foot ulcer associated with type 2 diabetes mellitus (HCC)    GERD (gastroesophageal reflux disease)    Gout    Hyperlipidemia    Hypertension    Neuropathic diabetic ulcer of foot (HCC)    Schwannoma    Past Surgical History:  Procedure Laterality Date   BONE EXOSTOSIS EXCISION Right 08/12/2020   Procedure: EXOSTOSIS EXCISION- SADDLEBONE X2- RIGHT;  Surgeon: Gwyneth Revels, DPM;  Location: [facility] ORS;  Service: Podiatry;  Laterality: Right;   BRAIN SURGERY  2006 x 3; 2010 x 1   Benign Tumor Removal [facility])   COLON SURGERY  2008   Dr. Katrinka Blazing [facility])   COLONOSCOPY WITH PROPOFOL N/A 11/08/2015   Procedure: COLONOSCOPY WITH PROPOFOL;  Surgeon: Christena Deem, MD;  Location: [facility] ENDOSCOPY;  Service: Endoscopy;  Laterality: N/A;   COLONOSCOPY WITH PROPOFOL N/A 11/09/2015   Procedure: COLONOSCOPY WITH PROPOFOL;  Surgeon: Christena Deem, MD;  Location: [facility] ENDOSCOPY;  Service: Endoscopy;  Laterality: N/A;   COLONOSCOPY WITH PROPOFOL N/A 10/21/2018   Procedure: COLONOSCOPY WITH PROPOFOL;  Surgeon: Christena Deem, MD;  Location[facility] ENDOSCOPY;  Service: Endoscopy;  Laterality: N/A;   FLEXIBLE BRONCHOSCOPY N/A 10/04/2015   Procedure: FLEXIBLE BRONCHOSCOPY;  Surgeon: Merwyn Katos, MD;   Location: [facility] ORS;  Service: Pulmonary;  Laterality: N/A;   Patient Active Problem List   Diagnosis Date Noted   Controlled type 2 diabetes mellitus without complication (HCC) 10/07/2015   Neurilemmoma 10/07/2015   History of colon polyps 10/07/2015   Alimentary obesity 10/07/2015   Combined fat and carbohydrate induced hyperlipemia 10/07/2015   H/O: gout 10/07/2015   ED (erectile dysfunction) of organic origin 10/07/2015   Allergic rhinitis 10/07/2015   Chronic kidney disease (CKD), stage III (moderate) (HCC) 03/23/2015   H/O meningitis 09/08/2006    PCP: Marisue Ivan, MD  REFERRING PROVIDER: Lonell Face, MD  REFERRING DIAG: Balance problem  THERAPY DIAG:  Balance problems  Muscle weakness (generalized)  Gait difficulty  Balance problem  Rationale for Evaluation and Treatment: Rehabilitation  ONSET DATE: 08/12/2020  SUBJECTIVE:   SUBJECTIVE STATEMENT: Pt reports initial onset of balance issues in 2006 when he had 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 He reports his balance has continued to get worse over time, particularly with immediate standing and gait initiation. Pt denies any dizziness/lightheadedness, syncope; denies falls in the last 6 months although has had numerous "near falls." Pt also reports low back pain (R>L) that typically worsens towards the end of the day.  PERTINENT HISTORY: R vestibular schwannoma, peripheral neuropathy, alcohol use, pre-diabetes PAIN:  Are  you having pain? Yes: NPRS scale: 3/10 Pain location: R low back Pain description: ache Aggravating factors: activity Relieving factors: Rest  PRECAUTIONS: Fall  RED FLAGS: None   WEIGHT BEARING RESTRICTIONS: No  FALLS:  Has patient fallen in last 6 months? No  LIVING ENVIRONMENT: Lives with: lives with their spouse Lives in: House/apartment Stairs: Yes: External: 4 steps; bilateral but cannot reach  both Has following equipment at home: Single point cane  OCCUPATION: Retired  PLOF: Independent  PATIENT GOALS: Get back to walking  NEXT MD VISIT: TBD  OBJECTIVE:   DIAGNOSTIC FINDINGS: N/A  PATIENT SURVEYS:  FOTO TBD next visit  COGNITION: Overall cognitive status: Within functional limits for tasks assessed     SENSATION: Light touch: Impaired  (Numbness bilaterally at level of malleoli and distally)   POSTURE: rounded shoulders   LOWER EXTREMITY ROM:  Active ROM Right eval Left eval  Hip flexion [facility]  Hip extension    Hip abduction [facility]  Hip adduction    Hip internal rotation [facility] WFL  Hip external rotation [facility]  Knee flexion    Knee extension [facility] WFL  Ankle dorsiflexion [facility] WFL  Ankle plantarflexion    Ankle inversion    Ankle eversion     (Blank rows = not tested)  LOWER EXTREMITY MMT:  MMT Right eval Left eval  Hip flexion 4 4  Hip extension    Hip abduction 4 4  Hip adduction    Hip internal rotation 5 5  Hip external rotation 4+ 4+  Knee flexion 4 4-  Knee extension 5 5  Ankle dorsiflexion 4 4  Ankle plantarflexion    Ankle inversion    Ankle eversion     (Blank rows = not tested)  FUNCTIONAL TESTS:  5 times sit to stand: 15.8 seconds Berg Balance Scale: 39/56 (increased fall risk)  GAIT: Distance walked: 200 feet in clinic/outside Assistive device utilized: None Level of assistance: Complete Independence Comments: Reciprocal gait pattern, slow gait speed, decreased step length bilaterally, narrow BoS   FOTO: initial 46/ goal 53  TODAY'S TREATMENT:                                                                                                                              DATE: 11/20/2022   Subjective:  Pt reports doing well today; says he worked a lot in his yard over the weekend and his R shoulder is a little sore (5/10 pain). Pt reports HEP still going well.   Neuro.mm.:  Romberg Stance on Foam pad in //-bars: 2 x  30 second holds each of adding head turns (L/R + Up/down) and eyes closed- CGA  Alternating foot taps onto 6 inch step: 2x10 each foot, no UE support - CGA  FGA exercises in hallway: 2 laps each of walking with narrow BoS, walking with head turns/visual scanning, reactive gait (quick stop/start, alternating speeds, turns) - CGA  There.ex.: HR low to  mid 90's, O2 94% on room air throughout session  Nustep x 10 mins to improve cardiovascular endurance and LE strength: Level 3, seat 11 with B LE, R UE.    Fwd Step-ups with no UE support: 2 x 10 each leg  Palloff Press (Standing anti-rotation) with blue TB: 1x15 each direction  Not Today: Tandem stance while shooting mini-basketball: x2 minutes each foot forward - R shoulder irritation after longer period of time.  CGA for safety. Walking in // bars while stepping over hurdles: 4 laps fwd, 4 laps sideways  PATIENT EDUCATION:  Education details: Use of SPC with transfers, ambulation; initial HEP Person educated: Patient Education method: Medical illustrator Education comprehension: verbalized understanding and returned demonstration  HOME EXERCISE PROGRAM: Access Code: [email or web address] URL: [email or web address] Date: 11/13/2022 Prepared by: Dorene Grebe  Exercises - Sit to Stand Without Arm Support  - 2 x daily - 7 x weekly - 1 sets - 10 reps - Standing Tandem Balance with Counter Support  - 2 x daily - 7 x weekly - 1 sets - 10 reps - Narrow Stance with Unilateral Counter Support  - 2 x daily - 7 x weekly - 1 sets - 10 reps  ASSESSMENT:  CLINICAL IMPRESSION: Pt presents to PT with reports of mild R shoulder pain secondary to performing a lot of yardwork over the weekend. Session today focused on progressing static/dynamic balance exercises and challenging the pt to perform exercises with no UE support. Session also consisted of exercises aimed to strengthen core/LE muscles. Pt required CGA throughout all  balance-specific exercises for safety; he had occasional losses of balance but was able to self-correct with stepping reactions. Pt will continue to benefit from skilled PT services to improve functional independence and return to PLOF.    OBJECTIVE IMPAIRMENTS: decreased activity tolerance, decreased balance, decreased knowledge of use of DME, difficulty walking, decreased strength, decreased safety awareness, and impaired sensation.   ACTIVITY LIMITATIONS: standing and squatting  PARTICIPATION LIMITATIONS: community activity  PERSONAL FACTORS: Age, Time since onset of injury/illness/exacerbation, and 3+ comorbidities: Vestibular Schwannoma, diabetic charcot foot, peripheral neuropathy, LBP  are also affecting patient's functional outcome.   REHAB POTENTIAL: Fair to Good  CLINICAL DECISION MAKING: Stable/uncomplicated  EVALUATION COMPLEXITY: Low   GOALS:   SHORT TERM GOALS: Target date: 12/04/2022 Pt will complete FOTO next visit. Baseline: N/A.  8/8: initial 46/ goal 53 Goal status: Goal met  2.  Pt will be independent and compliant with HEP in order to improve bilateral hip strength and static balance. Baseline: See above for MMT scores/balance scores Goal status: INITIAL   LONG TERM GOALS: Target date: 12/25/2022  Pt will decrease 5TSTS by at least 3 seconds in order to demonstrate decreased risk for falls. Baseline: 15.8 seconds  Goal status: INITIAL  2.  Pt will improve BERG to at least 45/56 to demonstrate improved static and dynamic balance and decreased risk for falls.  Baseline: 39/56 Goal status: INITIAL  3.  Pt will improve LE MMT scores to at least 4+/5 bilaterally to demonstrate improved LE strength. Baseline: See above Goal status: INITIAL  4.  Pt will increase FOTO score to goal to demonstrate improved balance and performance of ADL's/IADL's. Baseline: TBD Goal status: INITIAL   PLAN:  PT FREQUENCY: 2x/week  PT DURATION: 6 weeks  PLANNED  INTERVENTIONS: Therapeutic exercises, Therapeutic activity, Neuromuscular re-education, Balance training, Gait training, Patient/Family education, Self Care, Cryotherapy, and Moist heat  PLAN FOR NEXT SESSION: Progress LE/core strengthening exercises, progress static/dynamic balance  exercises, trial resisted gait + more FGA tasks  Cammie Mcgee, PT, DPT # [id number] TJ , SPT Cammie Mcgee, PT 11/20/2022, 10:51 AM

## 2022-11-22 ENCOUNTER — Ambulatory Visit: Payer: PPO | Admitting: Physical Therapy

## 2022-11-22 ENCOUNTER — Encounter: Payer: Self-pay | Admitting: Physical Therapy

## 2022-11-22 DIAGNOSIS — R269 Unspecified abnormalities of gait and mobility: Secondary | ICD-10-CM

## 2022-11-22 DIAGNOSIS — R2689 Other abnormalities of gait and mobility: Secondary | ICD-10-CM | POA: Diagnosis not present

## 2022-11-22 DIAGNOSIS — M6281 Muscle weakness (generalized): Secondary | ICD-10-CM

## 2022-11-22 NOTE — Therapy (Addendum)
OUTPATIENT PHYSICAL THERAPY LOWER EXTREMITY TREATMENT   Patient Name: Taylor Ross MRN: [id number] DOB:1945/11/05, 77 y.o., male Today's Date: 11/22/2022  END OF SESSION:  PT End of Session - 11/22/22 0852     Visit Number 4    Number of Visits 12    Date for PT Re-Evaluation 12/25/22    PT Start Time 0852    PT Stop Time 0941    PT Time Calculation (min) 49 min    Equipment Utilized During Treatment Gait belt    Activity Tolerance Patient tolerated treatment well    Behavior During Therapy WFL for tasks assessed/performed              Past Medical History:  Diagnosis Date   Allergic rhinitis    Bacterial meningitis    Diabetes mellitus without complication (HCC)    Diabetic foot ulcer associated with type 2 diabetes mellitus (HCC)    GERD (gastroesophageal reflux disease)    Gout    Hyperlipidemia    Hypertension    Neuropathic diabetic ulcer of foot (HCC)    Schwannoma    Past Surgical History:  Procedure Laterality Date   BONE EXOSTOSIS EXCISION Right 08/12/2020   Procedure: EXOSTOSIS EXCISION- SADDLEBONE X2- RIGHT;  Surgeon: Gwyneth Revels, DPM;  Location: [facility] ORS;  Service: Podiatry;  Laterality: Right;   BRAIN SURGERY  2006 x 3; 2010 x 1   Benign Tumor Removal [facility])   COLON SURGERY  2008   Dr. Katrinka Blazing [facility])   COLONOSCOPY WITH PROPOFOL N/A 11/08/2015   Procedure: COLONOSCOPY WITH PROPOFOL;  Surgeon: Christena Deem, MD;  Location: [facility] ENDOSCOPY;  Service: Endoscopy;  Laterality: N/A;   COLONOSCOPY WITH PROPOFOL N/A 11/09/2015   Procedure: COLONOSCOPY WITH PROPOFOL;  Surgeon: Christena Deem, MD;  Location: [facility] ENDOSCOPY;  Service: Endoscopy;  Laterality: N/A;   COLONOSCOPY WITH PROPOFOL N/A 10/21/2018   Procedure: COLONOSCOPY WITH PROPOFOL;  Surgeon: Christena Deem, MD;  [facility] ENDOSCOPY;  Service: Endoscopy;  Laterality: N/A;   FLEXIBLE BRONCHOSCOPY N/A 10/04/2015   Procedure: FLEXIBLE BRONCHOSCOPY;  Surgeon: Merwyn Katos, MD;   Location: [facility] ORS;  Service: Pulmonary;  Laterality: N/A;   Patient Active Problem List   Diagnosis Date Noted   Controlled type 2 diabetes mellitus without complication (HCC) 10/07/2015   Neurilemmoma 10/07/2015   History of colon polyps 10/07/2015   Alimentary obesity 10/07/2015   Combined fat and carbohydrate induced hyperlipemia 10/07/2015   H/O: gout 10/07/2015   ED (erectile dysfunction) of organic origin 10/07/2015   Allergic rhinitis 10/07/2015   Chronic kidney disease (CKD), stage III (moderate) (HCC) 03/23/2015   H/O meningitis 09/08/2006    PCP: Marisue Ivan, MD  REFERRING PROVIDER: Lonell Face, MD  REFERRING DIAG: Balance problem  THERAPY DIAG:  Balance problems  Muscle weakness (generalized)  Gait difficulty  Balance problem  Rationale for Evaluation and Treatment: Rehabilitation  ONSET DATE: 08/12/2020  SUBJECTIVE:   SUBJECTIVE STATEMENT: Pt reports initial onset of balance issues in 2006 when he had 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 He reports his balance has continued to get worse over time, particularly with immediate standing and gait initiation. Pt denies any dizziness/lightheadedness, syncope; denies falls in the last 6 months although has had numerous "near falls." Pt also reports low back pain (R>L) that typically worsens towards the end of the day.  PERTINENT HISTORY: R vestibular schwannoma, peripheral neuropathy, alcohol use, pre-diabetes PAIN:  Are  you having pain? Yes: NPRS scale: 3/10 Pain location: R low back Pain description: ache Aggravating factors: activity Relieving factors: Rest  PRECAUTIONS: Fall  RED FLAGS: None   WEIGHT BEARING RESTRICTIONS: No  FALLS:  Has patient fallen in last 6 months? No  LIVING ENVIRONMENT: Lives with: lives with their spouse Lives in: House/apartment Stairs: Yes: External: 4 steps; bilateral but cannot reach  both Has following equipment at home: Single point cane  OCCUPATION: Retired  PLOF: Independent  PATIENT GOALS: Get back to walking  NEXT MD VISIT: TBD  OBJECTIVE:   DIAGNOSTIC FINDINGS: N/A  PATIENT SURVEYS:  FOTO TBD next visit  COGNITION: Overall cognitive status: Within functional limits for tasks assessed     SENSATION: Light touch: Impaired  (Numbness bilaterally at level of malleoli and distally)   POSTURE: rounded shoulders   LOWER EXTREMITY ROM:  Active ROM Right eval Left eval  Hip flexion [facility]  Hip extension    Hip abduction [facility]  Hip adduction    Hip internal rotation [facility] WFL  Hip external rotation [facility]  Knee flexion    Knee extension [facility] WFL  Ankle dorsiflexion [facility] WFL  Ankle plantarflexion    Ankle inversion    Ankle eversion     (Blank rows = not tested)  LOWER EXTREMITY MMT:  MMT Right eval Left eval  Hip flexion 4 4  Hip extension    Hip abduction 4 4  Hip adduction    Hip internal rotation 5 5  Hip external rotation 4+ 4+  Knee flexion 4 4-  Knee extension 5 5  Ankle dorsiflexion 4 4  Ankle plantarflexion    Ankle inversion    Ankle eversion     (Blank rows = not tested)  FUNCTIONAL TESTS:  5 times sit to stand: 15.8 seconds Berg Balance Scale: 39/56 (increased fall risk)  GAIT: Distance walked: 200 feet in clinic/outside Assistive device utilized: None Level of assistance: Complete Independence Comments: Reciprocal gait pattern, slow gait speed, decreased step length bilaterally, narrow BoS   FOTO: initial 46/ goal 53  TODAY'S TREATMENT:                                                                                                                              DATE: 11/22/2022  Subjective:  Pt reports doing well today and pain is about the same in back.  No reports of falls or stumbles.  Pt. States he woke up with tooth pain and is going to dentist after appt. today.     Neuro.mm.:  Alternating foot taps  onto 6 inch step: 2x10 each foot, no UE support - CGA.  Walking cone taps at agility ladder with CGA.  Pt. Challenged with several episodes of knocking over cone.   Lateral walking at agility ladder with cone taps.   [employer] ex.:  NBOS/ tandem stance with light to no UE assist.   FGA exercises in hallway: 2 laps  each of walking with narrow BoS, walking with head turns/visual scanning, reactive gait (quick stop/start, alternating speeds, turns) - CGA  There.ex.:  Nustep x 10 mins to improve cardiovascular endurance and LE strength: Level 4, seat 11 with B LE, R UE.    Fwd Step-ups at 6" step with no UE support: 2 x 10 each leg  Nautilus: resisted gait 60# 5x all 4-planes.  CGA for safety (extra time/ focus with lateral walking).     PATIENT EDUCATION:  Education details: Use of SPC with transfers, ambulation; initial HEP Person educated: Patient Education method: Medical illustrator Education comprehension: verbalized understanding and returned demonstration  HOME EXERCISE PROGRAM: Access Code: [email or web address] URL: [email or web address] Date: 11/13/2022 Prepared by: Dorene Grebe  Exercises - Sit to Stand Without Arm Support  - 2 x daily - 7 x weekly - 1 sets - 10 reps - Standing Tandem Balance with Counter Support  - 2 x daily - 7 x weekly - 1 sets - 10 reps - Narrow Stance with Unilateral Counter Support  - 2 x daily - 7 x weekly - 1 sets - 10 reps  ASSESSMENT:  CLINICAL IMPRESSION: PT tx. session focused on progressing static/dynamic balance exercises and challenging the pt to perform exercises with no UE support. Session also consisted of exercises aimed to strengthen core/LE muscles. Pt required CGA throughout all balance-specific exercises for safety; he had occasional losses of balance but was able to self-correct with stepping reactions. Pt will continue to benefit from skilled PT services to improve functional independence and return to PLOF.    OBJECTIVE  IMPAIRMENTS: decreased activity tolerance, decreased balance, decreased knowledge of use of DME, difficulty walking, decreased strength, decreased safety awareness, and impaired sensation.   ACTIVITY LIMITATIONS: standing and squatting  PARTICIPATION LIMITATIONS: community activity  PERSONAL FACTORS: Age, Time since onset of injury/illness/exacerbation, and 3+ comorbidities: Vestibular Schwannoma, diabetic charcot foot, peripheral neuropathy, LBP  are also affecting patient's functional outcome.   REHAB POTENTIAL: Fair to Good  CLINICAL DECISION MAKING: Stable/uncomplicated  EVALUATION COMPLEXITY: Low   GOALS:   SHORT TERM GOALS: Target date: 12/04/2022 Pt will complete FOTO next visit. Baseline: N/A.  8/8: initial 46/ goal 53 Goal status: Goal met  2.  Pt will be independent and compliant with HEP in order to improve bilateral hip strength and static balance. Baseline: See above for MMT scores/balance scores Goal status: INITIAL   LONG TERM GOALS: Target date: 12/25/2022  Pt will decrease 5TSTS by at least 3 seconds in order to demonstrate decreased risk for falls. Baseline: 15.8 seconds  Goal status: INITIAL  2.  Pt will improve BERG to at least 45/56 to demonstrate improved static and dynamic balance and decreased risk for falls.  Baseline: 39/56 Goal status: INITIAL  3.  Pt will improve LE MMT scores to at least 4+/5 bilaterally to demonstrate improved LE strength. Baseline: See above Goal status: INITIAL  4.  Pt will increase FOTO score to goal to demonstrate improved balance and performance of ADL's/IADL's. Baseline: TBD Goal status: INITIAL   PLAN:  PT FREQUENCY: 2x/week  PT DURATION: 6 weeks  PLANNED INTERVENTIONS: Therapeutic exercises, Therapeutic activity, Neuromuscular re-education, Balance training, Gait training, Patient/Family education, Self Care, Cryotherapy, and Moist heat  PLAN FOR NEXT SESSION: Progress LE/core strengthening exercises,  progress static/dynamic balance exercises, trial resisted gait + more FGA tasks  Cammie Mcgee, PT, DPT # [id number] TJ [place], SPT Cammie Mcgee, PT 11/22/2022, 10:59 AM

## 2022-11-27 ENCOUNTER — Ambulatory Visit: Payer: PPO | Admitting: Physical Therapy

## 2022-11-29 ENCOUNTER — Encounter: Payer: Self-pay | Admitting: Physical Therapy

## 2022-11-29 ENCOUNTER — Ambulatory Visit: Payer: PPO | Admitting: Physical Therapy

## 2022-11-29 DIAGNOSIS — R2689 Other abnormalities of gait and mobility: Secondary | ICD-10-CM

## 2022-11-29 DIAGNOSIS — M6281 Muscle weakness (generalized): Secondary | ICD-10-CM

## 2022-11-29 DIAGNOSIS — R269 Unspecified abnormalities of gait and mobility: Secondary | ICD-10-CM

## 2022-11-29 NOTE — Therapy (Signed)
OUTPATIENT PHYSICAL THERAPY LOWER EXTREMITY TREATMENT   Patient Name: Taylor Ross MRN: [id number] DOB:23-Sep-1945, 77 y.o., male Today's Date: 11/29/2022  END OF SESSION:  PT End of Session - 11/29/22 0940     Visit Number 5    Number of Visits 12    Date for PT Re-Evaluation 12/25/22    PT Start Time 0845    PT Stop Time 0930    PT Time Calculation (min) 45 min    Equipment Utilized During Treatment Gait belt    Activity Tolerance Patient tolerated treatment well    Behavior During Therapy WFL for tasks assessed/performed             Past Medical History:  Diagnosis Date   Allergic rhinitis    Bacterial meningitis    Diabetes mellitus without complication (HCC)    Diabetic foot ulcer associated with type 2 diabetes mellitus (HCC)    GERD (gastroesophageal reflux disease)    Gout    Hyperlipidemia    Hypertension    Neuropathic diabetic ulcer of foot (HCC)    Schwannoma    Past Surgical History:  Procedure Laterality Date   BONE EXOSTOSIS EXCISION Right 08/12/2020   Procedure: EXOSTOSIS EXCISION- SADDLEBONE X2- RIGHT;  Surgeon: Gwyneth Revels, DPM;  Location: [facility] ORS;  Service: Podiatry;  Laterality: Right;   BRAIN SURGERY  2006 x 3; 2010 x 1   Benign Tumor Removal [facility])   COLON SURGERY  2008   Dr. Katrinka Blazing [facility])   COLONOSCOPY WITH PROPOFOL N/A 11/08/2015   Procedure: COLONOSCOPY WITH PROPOFOL;  Surgeon: Christena Deem, MD;  Location[facility] ENDOSCOPY;  Service: Endoscopy;  Laterality: N/A;   COLONOSCOPY WITH PROPOFOL N/A 11/09/2015   Procedure: COLONOSCOPY WITH PROPOFOL;  Surgeon: Christena Deem, MD;  Location: [facility] ENDOSCOPY;  Service: Endoscopy;  Laterality: N/A;   COLONOSCOPY WITH PROPOFOL N/A 10/21/2018   Procedure: COLONOSCOPY WITH PROPOFOL;  Surgeon: Christena Deem, MD;  Location: [facility] ENDOSCOPY;  Service: Endoscopy;  Laterality: N/A;   FLEXIBLE BRONCHOSCOPY N/A 10/04/2015   Procedure: FLEXIBLE BRONCHOSCOPY;  Surgeon: Merwyn Katos, MD;   Location: [facility] ORS;  Service: Pulmonary;  Laterality: N/A;   Patient Active Problem List   Diagnosis Date Noted   Controlled type 2 diabetes mellitus without complication (HCC) 10/07/2015   Neurilemmoma 10/07/2015   History of colon polyps 10/07/2015   Alimentary obesity 10/07/2015   Combined fat and carbohydrate induced hyperlipemia 10/07/2015   H/O: gout 10/07/2015   ED (erectile dysfunction) of organic origin 10/07/2015   Allergic rhinitis 10/07/2015   Chronic kidney disease (CKD), stage III (moderate) (HCC) 03/23/2015   H/O meningitis 09/08/2006    PCP: Marisue Ivan, MD  REFERRING PROVIDER: Lonell Face, MD  REFERRING DIAG: Balance problem  THERAPY DIAG:  Balance problems  Muscle weakness (generalized)  Gait difficulty  Balance problem  Rationale for Evaluation and Treatment: Rehabilitation  ONSET DATE: 08/12/2020  SUBJECTIVE:   SUBJECTIVE STATEMENT: Pt reports initial onset of balance issues in 2006 when he had 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 He reports his balance has continued to get worse over time, particularly with immediate standing and gait initiation. Pt denies any dizziness/lightheadedness, syncope; denies falls in the last 6 months although has had numerous "near falls." Pt also reports low back pain (R>L) that typically worsens towards the end of the day.  PERTINENT HISTORY: R vestibular schwannoma, peripheral neuropathy, alcohol use, pre-diabetes PAIN:  Are you  having pain? Yes: NPRS scale: 3/10 Pain location: R low back Pain description: ache Aggravating factors: activity Relieving factors: Rest  PRECAUTIONS: Fall  RED FLAGS: None   WEIGHT BEARING RESTRICTIONS: No  FALLS:  Has patient fallen in last 6 months? No  LIVING ENVIRONMENT: Lives with: lives with their spouse Lives in: House/apartment Stairs: Yes: External: 4 steps; bilateral but cannot reach  both Has following equipment at home: Single point cane  OCCUPATION: Retired  PLOF: Independent  PATIENT GOALS: Get back to walking  NEXT MD VISIT: TBD  OBJECTIVE:   DIAGNOSTIC FINDINGS: N/A  PATIENT SURVEYS:  FOTO TBD next visit  COGNITION: Overall cognitive status: Within functional limits for tasks assessed     SENSATION: Light touch: Impaired  (Numbness bilaterally at level of malleoli and distally)   POSTURE: rounded shoulders   LOWER EXTREMITY ROM:  Active ROM Right eval Left eval  Hip flexion [facility]  Hip extension    Hip abduction [facility]  Hip adduction    Hip internal rotation [facility] WFL  Hip external rotation [facility]  Knee flexion    Knee extension [facility] WFL  Ankle dorsiflexion [facility] WFL  Ankle plantarflexion    Ankle inversion    Ankle eversion     (Blank rows = not tested)  LOWER EXTREMITY MMT:  MMT Right eval Left eval  Hip flexion 4 4  Hip extension    Hip abduction 4 4  Hip adduction    Hip internal rotation 5 5  Hip external rotation 4+ 4+  Knee flexion 4 4-  Knee extension 5 5  Ankle dorsiflexion 4 4  Ankle plantarflexion    Ankle inversion    Ankle eversion     (Blank rows = not tested)  FUNCTIONAL TESTS:  5 times sit to stand: 15.8 seconds Berg Balance Scale: 39/56 (increased fall risk)  GAIT: Distance walked: 200 feet in clinic/outside Assistive device utilized: None Level of assistance: Complete Independence Comments: Reciprocal gait pattern, slow gait speed, decreased step length bilaterally, narrow BoS   FOTO: initial 46/ goal 53  TODAY'S TREATMENT:                                                                                                                              DATE: 11/29/2022  Subjective:  Pt reports doing well today and pain 4-5/10 in his back right now.  Pt had molar removed on Monday but other than that is doing fine; hasn't been able to do much of his HEP the last few days because of his tooth.    Neuro.mm.: Romberg Stance on foam: 2 x 30 second holds each of vertical/horizontal head turns, eyes open/closed  Tandem stance on foam: 1 x 1 minute holds each foot forward - VC's for keeping feet flat on ground  Alternating foot taps onto 6 inch step: 2x10 each foot, no UE support - CGA  Sit-to-stands on foam pad: 2 x 10, minimal UE  support (pt needed occasional support from // bars upon standing to maintain balance)- CGA throughout for safety  FGA exercises outside in parking lot: 2 laps each of walking with head turns/visual scanning, reactive gait (quick stop/start, alternating speeds, turns, changing directions) - CGA  There.ex.:  Nustep x 10 mins to improve cardiovascular endurance and LE strength: Level 4, seat 11 with B LE, R UE.     Not today: Walking cone taps at agility ladder with CGA.  Pt. Challenged with several episodes of knocking over cone.   Lateral walking at agility ladder with cone taps.  Fwd Step-ups at 6" step with no UE support: 2 x 10 each leg Nautilus: resisted gait 60# 5x all 4-planes.  CGA for safety (extra time/ focus with lateral walking).   PATIENT EDUCATION:  Education details: Use of SPC with transfers, ambulation; initial HEP Person educated: Patient Education method: Medical illustrator Education comprehension: verbalized understanding and returned demonstration  HOME EXERCISE PROGRAM: Access Code: [email or web address] URL: [email or web address] Date: 11/13/2022 Prepared by: Dorene Grebe  Exercises - Sit to Stand Without Arm Support  - 2 x daily - 7 x weekly - 1 sets - 10 reps - Standing Tandem Balance with Counter Support  - 2 x daily - 7 x weekly - 1 sets - 10 reps - Narrow Stance with Unilateral Counter Support  - 2 x daily - 7 x weekly - 1 sets - 10 reps  ASSESSMENT:  CLINICAL IMPRESSION: Pt. reports to PT with reports of usual low back pain; reports no falls since last session. Session today consisted of exercises  focused on improving static balance with narrow BoS, as well as maintaining static balance with position changes (sit-stand). Dynamic balance exercises performed at end of session to improve patient's stability while walking with changes in speed/direction. Pt. requires CGA throughout all exercises for safety. He has occasional losses of balance with narrow BoS exercises but is able to self-correct with UE support each time. Pt will continue to benefit from skilled PT services to improve functional independence and return to PLOF.    OBJECTIVE IMPAIRMENTS: decreased activity tolerance, decreased balance, decreased knowledge of use of DME, difficulty walking, decreased strength, decreased safety awareness, and impaired sensation.   ACTIVITY LIMITATIONS: standing and squatting  PARTICIPATION LIMITATIONS: community activity  PERSONAL FACTORS: Age, Time since onset of injury/illness/exacerbation, and 3+ comorbidities: Vestibular Schwannoma, diabetic charcot foot, peripheral neuropathy, LBP  are also affecting patient's functional outcome.   REHAB POTENTIAL: Fair to Good  CLINICAL DECISION MAKING: Stable/uncomplicated  EVALUATION COMPLEXITY: Low  GOALS:  SHORT TERM GOALS: Target date: 12/04/2022 Pt will complete FOTO next visit. Baseline: N/A.  8/8: initial 46/ goal 53 Goal status: Goal met  2.  Pt will be independent and compliant with HEP in order to improve bilateral hip strength and static balance. Baseline: See above for MMT scores/balance scores Goal status: INITIAL   LONG TERM GOALS: Target date: 12/25/2022  Pt will decrease 5TSTS by at least 3 seconds in order to demonstrate decreased risk for falls. Baseline: 15.8 seconds  Goal status: INITIAL  2.  Pt will improve BERG to at least 45/56 to demonstrate improved static and dynamic balance and decreased risk for falls.  Baseline: 39/56 Goal status: INITIAL  3.  Pt will improve LE MMT scores to at least 4+/5 bilaterally to  demonstrate improved LE strength. Baseline: See above Goal status: INITIAL  4.  Pt will increase FOTO score to goal to demonstrate improved balance and performance of ADL's/IADL's.  Baseline: TBD Goal status: INITIAL   PLAN:  PT FREQUENCY: 2x/week  PT DURATION: 6 weeks  PLANNED INTERVENTIONS: Therapeutic exercises, Therapeutic activity, Neuromuscular re-education, Balance training, Gait training, Patient/Family education, Self Care, Cryotherapy, and Moist heat  PLAN FOR NEXT SESSION: Progress LE/core strengthening exercises, progress static/dynamic balance exercises, trial resisted gait + more FGA tasks.  CHECK STGs.    Cammie Mcgee, PT, DPT # [phone] Cena Benton, SPT 11/29/22, 10:44 AM

## 2022-12-04 ENCOUNTER — Ambulatory Visit: Payer: PPO | Admitting: Physical Therapy

## 2022-12-04 ENCOUNTER — Encounter: Payer: Self-pay | Admitting: Physical Therapy

## 2022-12-04 DIAGNOSIS — M6281 Muscle weakness (generalized): Secondary | ICD-10-CM

## 2022-12-04 DIAGNOSIS — R269 Unspecified abnormalities of gait and mobility: Secondary | ICD-10-CM

## 2022-12-04 DIAGNOSIS — R2689 Other abnormalities of gait and mobility: Secondary | ICD-10-CM

## 2022-12-04 NOTE — Therapy (Signed)
OUTPATIENT PHYSICAL THERAPY LOWER EXTREMITY TREATMENT   Patient Name: Taylor Ross MRN: [id number] DOB:24-Dec-1945, 77 y.o., male Today's Date: 12/04/2022  END OF SESSION:  PT End of Session - 12/04/22 0928     Visit Number 6    Number of Visits 12    Date for PT Re-Evaluation 12/25/22    PT Start Time 0841    PT Stop Time 0927    PT Time Calculation (min) 46 min    Equipment Utilized During Treatment Gait belt    Activity Tolerance Patient tolerated treatment well    Behavior During Therapy WFL for tasks assessed/performed              Past Medical History:  Diagnosis Date   Allergic rhinitis    Bacterial meningitis    Diabetes mellitus without complication (HCC)    Diabetic foot ulcer associated with type 2 diabetes mellitus (HCC)    GERD (gastroesophageal reflux disease)    Gout    Hyperlipidemia    Hypertension    Neuropathic diabetic ulcer of foot (HCC)    Schwannoma    Past Surgical History:  Procedure Laterality Date   BONE EXOSTOSIS EXCISION Right 08/12/2020   Procedure: EXOSTOSIS EXCISION- SADDLEBONE X2- RIGHT;  Surgeon: Gwyneth Revels, DPM;  Location: [facility] ORS;  Service: Podiatry;  Laterality: Right;   BRAIN SURGERY  2006 x 3; 2010 x 1   Benign Tumor Removal [facility])   COLON SURGERY  2008   Dr. Katrinka Blazing [facility])   COLONOSCOPY WITH PROPOFOL N/A 11/08/2015   Procedure: COLONOSCOPY WITH PROPOFOL;  Surgeon: Christena Deem, MD;  Location: [facility] ENDOSCOPY;  Service: Endoscopy;  Laterality: N/A;   COLONOSCOPY WITH PROPOFOL N/A 11/09/2015   Procedure: COLONOSCOPY WITH PROPOFOL;  Surgeon: Christena Deem, MD;  Location: [facility] ENDOSCOPY;  Service: Endoscopy;  Laterality: N/A;   COLONOSCOPY WITH PROPOFOL N/A 10/21/2018   Procedure: COLONOSCOPY WITH PROPOFOL;  Surgeon: Christena Deem, MD;  Location: [facility] ENDOSCOPY;  Service: Endoscopy;  Laterality: N/A;   FLEXIBLE BRONCHOSCOPY N/A 10/04/2015   Procedure: FLEXIBLE BRONCHOSCOPY;  Surgeon: Merwyn Katos, MD;   Location: [facility] ORS;  Service: Pulmonary;  Laterality: N/A;   Patient Active Problem List   Diagnosis Date Noted   Controlled type 2 diabetes mellitus without complication (HCC) 10/07/2015   Neurilemmoma 10/07/2015   History of colon polyps 10/07/2015   Alimentary obesity 10/07/2015   Combined fat and carbohydrate induced hyperlipemia 10/07/2015   H/O: gout 10/07/2015   ED (erectile dysfunction) of organic origin 10/07/2015   Allergic rhinitis 10/07/2015   Chronic kidney disease (CKD), stage III (moderate) (HCC) 03/23/2015   H/O meningitis 09/08/2006    PCP: Marisue Ivan, MD  REFERRING PROVIDER: Lonell Face, MD  REFERRING DIAG: Balance problem  THERAPY DIAG:  Balance problems  Muscle weakness (generalized)  Gait difficulty  Balance problem  Rationale for Evaluation and Treatment: Rehabilitation  ONSET DATE: 08/12/2020  SUBJECTIVE:   SUBJECTIVE STATEMENT: Pt reports initial onset of balance issues in 2006 when he had 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 He reports his balance has continued to get worse over time, particularly with immediate standing and gait initiation. Pt denies any dizziness/lightheadedness, syncope; denies falls in the last 6 months although has had numerous "near falls." Pt also reports low back pain (R>L) that typically worsens towards the end of the day.  PERTINENT HISTORY: R vestibular schwannoma, peripheral neuropathy, alcohol use, pre-diabetes PAIN:  Are  you having pain? Yes: NPRS scale: 3/10 Pain location: R low back Pain description: ache Aggravating factors: activity Relieving factors: Rest  PRECAUTIONS: Fall  RED FLAGS: None   WEIGHT BEARING RESTRICTIONS: No  FALLS:  Has patient fallen in last 6 months? No  LIVING ENVIRONMENT: Lives with: lives with their spouse Lives in: House/apartment Stairs: Yes: External: 4 steps; bilateral but cannot reach  both Has following equipment at home: Single point cane  OCCUPATION: Retired  PLOF: Independent  PATIENT GOALS: Get back to walking  NEXT MD VISIT: TBD  OBJECTIVE:   DIAGNOSTIC FINDINGS: N/A  PATIENT SURVEYS:  FOTO TBD next visit  COGNITION: Overall cognitive status: Within functional limits for tasks assessed     SENSATION: Light touch: Impaired  (Numbness bilaterally at level of malleoli and distally)   POSTURE: rounded shoulders   LOWER EXTREMITY ROM:  Active ROM Right eval Left eval  Hip flexion [facility]  Hip extension    Hip abduction [facility]  Hip adduction    Hip internal rotation [facility] WFL  Hip external rotation [facility]  Knee flexion    Knee extension [facility] WFL  Ankle dorsiflexion [facility] WFL  Ankle plantarflexion    Ankle inversion    Ankle eversion     (Blank rows = not tested)  LOWER EXTREMITY MMT:  MMT Right eval Left eval  Hip flexion 4 4  Hip extension    Hip abduction 4 4  Hip adduction    Hip internal rotation 5 5  Hip external rotation 4+ 4+  Knee flexion 4 4-  Knee extension 5 5  Ankle dorsiflexion 4 4  Ankle plantarflexion    Ankle inversion    Ankle eversion     (Blank rows = not tested)  FUNCTIONAL TESTS:  5 times sit to stand: 15.8 seconds Berg Balance Scale: 39/56 (increased fall risk)  GAIT: Distance walked: 200 feet in clinic/outside Assistive device utilized: None Level of assistance: Complete Independence Comments: Reciprocal gait pattern, slow gait speed, decreased step length bilaterally, narrow BoS   FOTO: initial 46/ goal 53  TODAY'S TREATMENT:                                                                                                                              DATE: 12/04/2022  Subjective:  Pt reports feeling "about normal" today; 4/10 low back pain. Says HEP is still going well although he feels that the stair taps are a good challenge and he struggles with them more at home vs. In the clinic.  MMT:    MMT Right 12/04/2022 Left 12/04/2022  Hip flexion 4+ 4+  Hip abduction 4 4  Hip adduction    Hip internal rotation 5 5  Hip external rotation 5 5  Knee flexion 5 5  Knee extension 4+ 4+  Ankle dorsiflexion 5 4  (Blank rows = not tested)  BERG Balance Test: 47/56 (improved from 39/56 on initial eval)  5XSTS:  17.2 seconds first attempt, 13.01 seconds second attempt   Neuro.mm.: Standing perturbation training at // bars: black TB around waist with PT providing anterior/posterior perturbations, alternating eyes open/closed, 1 x 2-3 minute bouts each direction  Reaching outside BoS tasks: bending over to pick up cone off ground with one LE on foam pad, x5 pick-ups each leg - CGA for safety  FGA exercises outside in grass/uneven terrain: 2 laps each of walking with head turns/visual scanning, reactive gait (quick stop/start, alternating speeds, turns, changing directions) - CGA  There.ex.:  Nustep x 10 mins to improve cardiovascular endurance and LE strength: Level 4, seat 11 with B LE, R UE.     Not today: Romberg Stance on foam: 2 x 30 second holds each of vertical/horizontal head turns, eyes open/closed Tandem stance on foam: 1 x 1 minute holds each foot forward - VC's for keeping feet flat on ground Alternating foot taps onto 6 inch step: 2x10 each foot, no UE support - CGA Sit-to-stands on foam pad: 2 x 10, minimal UE support (pt needed occasional support from // bars upon standing to maintain balance)- CGA throughout for safety Walking cone taps at agility ladder with CGA.  Pt. Challenged with several episodes of knocking over cone.   Lateral walking at agility ladder with cone taps.  Fwd Step-ups at 6" step with no UE support: 2 x 10 each leg Nautilus: resisted gait 60# 5x all 4-planes.  CGA for safety (extra time/ focus with lateral walking).   PATIENT EDUCATION:  Education details: Use of SPC with transfers, ambulation; initial HEP Person educated: Patient Education  method: Medical illustrator Education comprehension: verbalized understanding and returned demonstration  HOME EXERCISE PROGRAM: Access Code: [email or web address] URL: [email or web address] Date: 11/13/2022 Prepared by: Dorene Grebe  Exercises - Sit to Stand Without Arm Support  - 2 x daily - 7 x weekly - 1 sets - 10 reps - Standing Tandem Balance with Counter Support  - 2 x daily - 7 x weekly - 1 sets - 10 reps - Narrow Stance with Unilateral Counter Support  - 2 x daily - 7 x weekly - 1 sets - 10 reps  ASSESSMENT:  CLINICAL IMPRESSION: Pt. reports to PT with reports of usual low back pain. Today's session consisted of reassessing LE MMT, BERG, and 5XSTS tests. Pt demonstrated mild improvements in gross LE strength and MMT scores. Pt additionally demonstrated clinically significant improvement in BERG balance and 5XSTS scores. Pt is now above the cutoff score for BERG, however, pt is still at increased risk for falls based on 5XSTS time. The rest of the session consisted of exercises to improve static balance with perturbations, ankle/hip strategy, as well as maintaining balance while bending over to pick up items off the ground. CGA throughout all balance exercises for safety. Pt will continue to benefit from skilled PT services to improve functional independence and return to PLOF.    OBJECTIVE IMPAIRMENTS: decreased activity tolerance, decreased balance, decreased knowledge of use of DME, difficulty walking, decreased strength, decreased safety awareness, and impaired sensation.   ACTIVITY LIMITATIONS: standing and squatting  PARTICIPATION LIMITATIONS: community activity  PERSONAL FACTORS: Age, Time since onset of injury/illness/exacerbation, and 3+ comorbidities: Vestibular Schwannoma, diabetic charcot foot, peripheral neuropathy, LBP  are also affecting patient's functional outcome.   REHAB POTENTIAL: Fair to Good  CLINICAL DECISION MAKING:  Stable/uncomplicated  EVALUATION COMPLEXITY: Low  GOALS:  SHORT TERM GOALS: Target date: 12/04/2022 Pt will complete FOTO next visit. Baseline: N/A.  8/8: initial 46/  goal 53 Goal status: Goal met  2.  Pt will be independent and compliant with HEP in order to improve bilateral hip strength and static balance. Baseline: See above for MMT scores/balance scores Goal status: GOAL MET, see above for updated MMT and balance assessment scores   LONG TERM GOALS: Target date: 12/25/2022  Pt will decrease 5TSTS by at least 3 seconds in order to demonstrate decreased risk for falls. Baseline: 15.8 seconds; (12/04/2022) 13.01 seconds Goal status: INITIAL  2.  Pt will improve BERG to at least 45/56 to demonstrate improved static and dynamic balance and decreased risk for falls.  Baseline: 39/56; (12/04/2022) 47/56 Goal status: INITIAL  3.  Pt will improve LE MMT scores to at least 4+/5 bilaterally to demonstrate improved LE strength. Baseline: See above Goal status: INITIAL  4.  Pt will increase FOTO score to goal to demonstrate improved balance and performance of ADL's/IADL's. Baseline: TBD Goal status: INITIAL   PLAN:  PT FREQUENCY: 2x/week  PT DURATION: 6 weeks  PLANNED INTERVENTIONS: Therapeutic exercises, Therapeutic activity, Neuromuscular re-education, Balance training, Gait training, Patient/Family education, Self Care, Cryotherapy, and Moist heat  PLAN FOR NEXT SESSION: Progress LE/core strengthening exercises, progress static/dynamic balance exercises, resisted gait + more FGA tasks on uneven terrain; progress perturbation/reaching/bending exercises   Cammie Mcgee, PT, DPT # [phone] Cena Benton, SPT 12/04/22, 7:21 PM

## 2022-12-06 ENCOUNTER — Ambulatory Visit: Payer: PPO | Admitting: Physical Therapy

## 2022-12-06 ENCOUNTER — Encounter: Payer: Self-pay | Admitting: Physical Therapy

## 2022-12-06 DIAGNOSIS — R2689 Other abnormalities of gait and mobility: Secondary | ICD-10-CM | POA: Diagnosis not present

## 2022-12-06 DIAGNOSIS — M6281 Muscle weakness (generalized): Secondary | ICD-10-CM

## 2022-12-06 DIAGNOSIS — R269 Unspecified abnormalities of gait and mobility: Secondary | ICD-10-CM

## 2022-12-06 NOTE — Therapy (Signed)
OUTPATIENT PHYSICAL THERAPY LOWER EXTREMITY TREATMENT   Patient Name: Taylor Ross MRN: [id number] DOB:May 12, 1945, 77 y.o., male Today's Date: 12/06/2022  END OF SESSION:  PT End of Session - 12/06/22 0841     Visit Number 7    Number of Visits 12    Date for PT Re-Evaluation 12/25/22    PT Start Time 0840    PT Stop Time 0925    PT Time Calculation (min) 45 min    Equipment Utilized During Treatment Gait belt    Activity Tolerance Patient tolerated treatment well              Past Medical History:  Diagnosis Date   Allergic rhinitis    Bacterial meningitis    Diabetes mellitus without complication (HCC)    Diabetic foot ulcer associated with type 2 diabetes mellitus (HCC)    GERD (gastroesophageal reflux disease)    Gout    Hyperlipidemia    Hypertension    Neuropathic diabetic ulcer of foot (HCC)    Schwannoma    Past Surgical History:  Procedure Laterality Date   BONE EXOSTOSIS EXCISION Right 08/12/2020   Procedure: EXOSTOSIS EXCISION- SADDLEBONE X2- RIGHT;  Surgeon: Gwyneth Revels, DPM;  Location: [facility] ORS;  Service: Podiatry;  Laterality: Right;   BRAIN SURGERY  2006 x 3; 2010 x 1   Benign Tumor Removal [facility])   COLON SURGERY  2008   Dr. Katrinka Blazing [facility])   COLONOSCOPY WITH PROPOFOL N/A 11/08/2015   Procedure: COLONOSCOPY WITH PROPOFOL;  Surgeon: Christena Deem, MD;  Location: [facility] ENDOSCOPY;  Service: Endoscopy;  Laterality: N/A;   COLONOSCOPY WITH PROPOFOL N/A 11/09/2015   Procedure: COLONOSCOPY WITH PROPOFOL;  Surgeon: Christena Deem, MD;  Location: [facility] ENDOSCOPY;  Service: Endoscopy;  Laterality: N/A;   COLONOSCOPY WITH PROPOFOL N/A 10/21/2018   Procedure: COLONOSCOPY WITH PROPOFOL;  Surgeon: Christena Deem, MD;  Location: [facility] ENDOSCOPY;  Service: Endoscopy;  Laterality: N/A;   FLEXIBLE BRONCHOSCOPY N/A 10/04/2015   Procedure: FLEXIBLE BRONCHOSCOPY;  Surgeon: Merwyn Katos, MD;  Location: [facility] ORS;  Service: Pulmonary;  Laterality: N/A;    Patient Active Problem List   Diagnosis Date Noted   Controlled type 2 diabetes mellitus without complication (HCC) 10/07/2015   Neurilemmoma 10/07/2015   History of colon polyps 10/07/2015   Alimentary obesity 10/07/2015   Combined fat and carbohydrate induced hyperlipemia 10/07/2015   H/O: gout 10/07/2015   ED (erectile dysfunction) of organic origin 10/07/2015   Allergic rhinitis 10/07/2015   Chronic kidney disease (CKD), stage III (moderate) (HCC) 03/23/2015   H/O meningitis 09/08/2006    PCP: Marisue Ivan, MD  REFERRING PROVIDER: Lonell Face, MD  REFERRING DIAG: Balance problem  THERAPY DIAG:  Balance problems  Gait difficulty  Muscle weakness (generalized)  Balance problem  Rationale for Evaluation and Treatment: Rehabilitation  ONSET DATE: 08/12/2020  SUBJECTIVE:   SUBJECTIVE STATEMENT: Pt reports initial onset of balance issues in 2006 when he had 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 He reports his balance has continued to get worse over time, particularly with immediate standing and gait initiation. Pt denies any dizziness/lightheadedness, syncope; denies falls in the last 6 months although has had numerous "near falls." Pt also reports low back pain (R>L) that typically worsens towards the end of the day.  PERTINENT HISTORY: R vestibular schwannoma, peripheral neuropathy, alcohol use, pre-diabetes PAIN:  Are you having pain? Yes: NPRS scale: 3/10 Pain location: R  low back Pain description: ache Aggravating factors: activity Relieving factors: Rest  PRECAUTIONS: Fall  RED FLAGS: None   WEIGHT BEARING RESTRICTIONS: No  FALLS:  Has patient fallen in last 6 months? No  LIVING ENVIRONMENT: Lives with: lives with their spouse Lives in: House/apartment Stairs: Yes: External: 4 steps; bilateral but cannot reach both Has following equipment at home: Single point  cane  OCCUPATION: Retired  PLOF: Independent  PATIENT GOALS: Get back to walking  NEXT MD VISIT: TBD  OBJECTIVE:   DIAGNOSTIC FINDINGS: N/A  PATIENT SURVEYS:  FOTO TBD next visit  COGNITION: Overall cognitive status: Within functional limits for tasks assessed     SENSATION: Light touch: Impaired  (Numbness bilaterally at level of malleoli and distally)   POSTURE: rounded shoulders   LOWER EXTREMITY ROM:  Active ROM Right eval Left eval  Hip flexion [facility]  Hip extension    Hip abduction [facility]  Hip adduction    Hip internal rotation [facility] WFL  Hip external rotation [facility]  Knee flexion    Knee extension [facility] WFL  Ankle dorsiflexion [facility] WFL  Ankle plantarflexion    Ankle inversion    Ankle eversion     (Blank rows = not tested)  LOWER EXTREMITY MMT:  MMT Right eval Left eval  Hip flexion 4 4  Hip extension    Hip abduction 4 4  Hip adduction    Hip internal rotation 5 5  Hip external rotation 4+ 4+  Knee flexion 4 4-  Knee extension 5 5  Ankle dorsiflexion 4 4  Ankle plantarflexion    Ankle inversion    Ankle eversion     (Blank rows = not tested)  FUNCTIONAL TESTS:  5 times sit to stand: 15.8 seconds Berg Balance Scale: 39/56 (increased fall risk)  GAIT: Distance walked: 200 feet in clinic/outside Assistive device utilized: None Level of assistance: Complete Independence Comments: Reciprocal gait pattern, slow gait speed, decreased step length bilaterally, narrow BoS   FOTO: initial 46/ goal 53  TODAY'S TREATMENT:                                                                                                                              DATE: 12/06/2022  Subjective:  Pt reports feeling feeling usual 4/10 low back pain this morning. Says HEP is still going well, he says doing exercises with his eyes closed is still a good challenge.   Neuro.mm.: Walking in // bars over blue mat with ankle weights underneath for uneven terrain: 3 laps  each of walking forward, lateral, backwards - no instances of LoB, CGA throughout for safety  Marching in place on foam pad: 2x10 marches each side - CGA  Alternating foot taps onto 6 inch step: 2x10 each foot, no UE support - CGA  FGA exercises outside in grass/uneven terrain: 4 laps of walking with head turns/visual scanning, reactive gait (quick stop/start, alternating speeds, turns, changing directions) - CGA  There.ex.:  Nustep x 10 mins to improve cardiovascular endurance and LE strength: Level 4, seat 11 with B LE, R UE.    Nautilus: resisted gait 70# 5x all 4-planes - CGA for safety; occasional VC's for core engagement; mild LBP with backwards walking   Not today: Reaching outside BoS tasks: bending over to pick up cone off ground with one LE on foam pad, x5 pick-ups each leg - CGA for safety Standing perturbation training at // bars: black TB around waist with PT providing anterior/posterior perturbations, alternating eyes open/closed, 1 x 2-3 minute bouts each direction Romberg Stance on foam: 2 x 30 second holds each of vertical/horizontal head turns, eyes open/closed Tandem stance on foam: 1 x 1 minute holds each foot forward - VC's for keeping feet flat on ground Sit-to-stands on foam pad: 2 x 10, minimal UE support (pt needed occasional support from // bars upon standing to maintain balance)- CGA throughout for safety Walking cone taps at agility ladder with CGA.  Pt. Challenged with several episodes of knocking over cone.   Lateral walking at agility ladder with cone taps.  Fwd Step-ups at 6" step with no UE support: 2 x 10 each leg  PATIENT EDUCATION:  Education details: Use of SPC with transfers, ambulation; initial HEP Person educated: Patient Education method: Medical illustrator Education comprehension: verbalized understanding and returned demonstration  HOME EXERCISE PROGRAM: Access Code: [email or web address] URL: [email or web address] Date:  11/13/2022 Prepared by: Dorene Grebe  Exercises - Sit to Stand Without Arm Support  - 2 x daily - 7 x weekly - 1 sets - 10 reps - Standing Tandem Balance with Counter Support  - 2 x daily - 7 x weekly - 1 sets - 10 reps - Narrow Stance with Unilateral Counter Support  - 2 x daily - 7 x weekly - 1 sets - 10 reps  ASSESSMENT:  CLINICAL IMPRESSION: Pt. reports to PT with reports of usual low back pain. Today's session consisted of static/dynamic balance exercises to work on improving walking on uneven surfaces, quick stop/starts and directional changes, unilateral stability, and walking with resistance/perturbations. Pt had no instances of LoB today; CGA throughout all exercises for safety. Pt will continue to benefit from skilled PT services to improve functional independence and return to PLOF.    OBJECTIVE IMPAIRMENTS: decreased activity tolerance, decreased balance, decreased knowledge of use of DME, difficulty walking, decreased strength, decreased safety awareness, and impaired sensation.   ACTIVITY LIMITATIONS: standing and squatting  PARTICIPATION LIMITATIONS: community activity  PERSONAL FACTORS: Age, Time since onset of injury/illness/exacerbation, and 3+ comorbidities: Vestibular Schwannoma, diabetic charcot foot, peripheral neuropathy, LBP  are also affecting patient's functional outcome.   REHAB POTENTIAL: Fair to Good  CLINICAL DECISION MAKING: Stable/uncomplicated  EVALUATION COMPLEXITY: Low  GOALS:  SHORT TERM GOALS: Target date: 12/04/2022 Pt will complete FOTO next visit. Baseline: N/A.  8/8: initial 46/ goal 53 Goal status: Goal met  2.  Pt will be independent and compliant with HEP in order to improve bilateral hip strength and static balance. Baseline: See above for MMT scores/balance scores Goal status: GOAL MET, see above for updated MMT and balance assessment scores   LONG TERM GOALS: Target date: 12/25/2022  Pt will decrease 5TSTS by at least 3 seconds in  order to demonstrate decreased risk for falls. Baseline: 15.8 seconds; (12/04/2022) 13.01 seconds Goal status: INITIAL  2.  Pt will improve BERG to at least 45/56 to demonstrate improved static and dynamic balance and decreased risk for falls.  Baseline: 39/56; (12/04/2022) 47/56 Goal status: INITIAL  3.  Pt will improve LE MMT scores to at least 4+/5 bilaterally to demonstrate improved LE strength. Baseline: See above Goal status: INITIAL  4.  Pt will increase FOTO score to goal to demonstrate improved balance and performance of ADL's/IADL's. Baseline: TBD Goal status: INITIAL   PLAN:  PT FREQUENCY: 2x/week  PT DURATION: 6 weeks  PLANNED INTERVENTIONS: Therapeutic exercises, Therapeutic activity, Neuromuscular re-education, Balance training, Gait training, Patient/Family education, Self Care, Cryotherapy, and Moist heat  PLAN FOR NEXT SESSION: Progress LE/core strengthening exercises, progress static/dynamic balance exercises, resisted gait + more FGA tasks on uneven terrain; progress perturbation/reaching/bending exercises, modify/progress HEP   Amarie Viles, SPT 12/06/22, 9:35 AM

## 2022-12-11 ENCOUNTER — Ambulatory Visit: Payer: PPO | Attending: Neurology | Admitting: Physical Therapy

## 2022-12-11 ENCOUNTER — Encounter: Payer: Self-pay | Admitting: Physical Therapy

## 2022-12-11 DIAGNOSIS — M6281 Muscle weakness (generalized): Secondary | ICD-10-CM | POA: Diagnosis not present

## 2022-12-11 DIAGNOSIS — E1142 Type 2 diabetes mellitus with diabetic polyneuropathy: Secondary | ICD-10-CM | POA: Diagnosis not present

## 2022-12-11 DIAGNOSIS — R269 Unspecified abnormalities of gait and mobility: Secondary | ICD-10-CM | POA: Insufficient documentation

## 2022-12-11 DIAGNOSIS — E11610 Type 2 diabetes mellitus with diabetic neuropathic arthropathy: Secondary | ICD-10-CM | POA: Diagnosis not present

## 2022-12-11 DIAGNOSIS — R2689 Other abnormalities of gait and mobility: Secondary | ICD-10-CM | POA: Diagnosis not present

## 2022-12-11 NOTE — Therapy (Signed)
OUTPATIENT PHYSICAL THERAPY LOWER EXTREMITY TREATMENT   Patient Name: Taylor Ross MRN: [id number] DOB:May 05, 1945, 77 y.o., male Today's Date: 12/11/2022  END OF SESSION:  PT End of Session - 12/11/22 0726     Visit Number 8    Number of Visits 12    Date for PT Re-Evaluation 12/25/22    PT Start Time 0720    PT Stop Time 0810    PT Time Calculation (min) 50 min    Equipment Utilized During Treatment Gait belt    Activity Tolerance Patient tolerated treatment well    Behavior During Therapy WFL for tasks assessed/performed               Past Medical History:  Diagnosis Date   Allergic rhinitis    Bacterial meningitis    Diabetes mellitus without complication (HCC)    Diabetic foot ulcer associated with type 2 diabetes mellitus (HCC)    GERD (gastroesophageal reflux disease)    Gout    Hyperlipidemia    Hypertension    Neuropathic diabetic ulcer of foot (HCC)    Schwannoma    Past Surgical History:  Procedure Laterality Date   BONE EXOSTOSIS EXCISION Right 08/12/2020   Procedure: EXOSTOSIS EXCISION- SADDLEBONE X2- RIGHT;  Surgeon: Gwyneth Revels, DPM;  Location: [facility] ORS;  Service: Podiatry;  Laterality: Right;   BRAIN SURGERY  2006 x 3; 2010 x 1   Benign Tumor Removal [facility])   COLON SURGERY  2008   Dr. Katrinka Blazing [facility])   COLONOSCOPY WITH PROPOFOL N/A 11/08/2015   Procedure: COLONOSCOPY WITH PROPOFOL;  Surgeon: Christena Deem, MD;  Location[facility] ENDOSCOPY;  Service: Endoscopy;  Laterality: N/A;   COLONOSCOPY WITH PROPOFOL N/A 11/09/2015   Procedure: COLONOSCOPY WITH PROPOFOL;  Surgeon: Christena Deem, MD;  Location: [facility] ENDOSCOPY;  Service: Endoscopy;  Laterality: N/A;   COLONOSCOPY WITH PROPOFOL N/A 10/21/2018   Procedure: COLONOSCOPY WITH PROPOFOL;  Surgeon: Christena Deem, MD;  Location: [facility] ENDOSCOPY;  Service: Endoscopy;  Laterality: N/A;   FLEXIBLE BRONCHOSCOPY N/A 10/04/2015   Procedure: FLEXIBLE BRONCHOSCOPY;  Surgeon: Merwyn Katos,  MD;  Location: [facility] ORS;  Service: Pulmonary;  Laterality: N/A;   Patient Active Problem List   Diagnosis Date Noted   Controlled type 2 diabetes mellitus without complication (HCC) 10/07/2015   Neurilemmoma 10/07/2015   History of colon polyps 10/07/2015   Alimentary obesity 10/07/2015   Combined fat and carbohydrate induced hyperlipemia 10/07/2015   H/O: gout 10/07/2015   ED (erectile dysfunction) of organic origin 10/07/2015   Allergic rhinitis 10/07/2015   Chronic kidney disease (CKD), stage III (moderate) (HCC) 03/23/2015   H/O meningitis 09/08/2006    PCP: Marisue Ivan, MD  REFERRING PROVIDER: Lonell Face, MD  REFERRING DIAG: Balance problem  THERAPY DIAG:  Balance problems  Gait difficulty  Muscle weakness (generalized)  Balance problem  Rationale for Evaluation and Treatment: Rehabilitation  ONSET DATE: 08/12/2020  SUBJECTIVE:   SUBJECTIVE STATEMENT: Pt reports initial onset of balance issues in 2006 when he had 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 He reports his balance has continued to get worse over time, particularly with immediate standing and gait initiation. Pt denies any dizziness/lightheadedness, syncope; denies falls in the last 6 months although has had numerous "near falls." Pt also reports low back pain (R>L) that typically worsens towards the end of the day.  PERTINENT HISTORY: R vestibular schwannoma, peripheral neuropathy, alcohol use, pre-diabetes PAIN:  Are you having pain? Yes: NPRS scale: 3/10 Pain location: R low back Pain description: ache Aggravating factors: activity Relieving factors: Rest  PRECAUTIONS: Fall  RED FLAGS: None   WEIGHT BEARING RESTRICTIONS: No  FALLS:  Has patient fallen in last 6 months? No  LIVING ENVIRONMENT: Lives with: lives with their spouse Lives in: House/apartment Stairs: Yes: External: 4 steps; bilateral but cannot  reach both Has following equipment at home: Single point cane  OCCUPATION: Retired  PLOF: Independent  PATIENT GOALS: Get back to walking  NEXT MD VISIT: TBD  OBJECTIVE:   DIAGNOSTIC FINDINGS: N/A  PATIENT SURVEYS:  FOTO TBD next visit  COGNITION: Overall cognitive status: Within functional limits for tasks assessed     SENSATION: Light touch: Impaired  (Numbness bilaterally at level of malleoli and distally)   POSTURE: rounded shoulders   LOWER EXTREMITY ROM:  Active ROM Right eval Left eval  Hip flexion [facility]  Hip extension    Hip abduction [facility]  Hip adduction    Hip internal rotation [facility] WFL  Hip external rotation [facility]  Knee flexion    Knee extension [facility] WFL  Ankle dorsiflexion [facility] WFL  Ankle plantarflexion    Ankle inversion    Ankle eversion     (Blank rows = not tested)  LOWER EXTREMITY MMT:  MMT Right eval Left eval  Hip flexion 4 4  Hip extension    Hip abduction 4 4  Hip adduction    Hip internal rotation 5 5  Hip external rotation 4+ 4+  Knee flexion 4 4-  Knee extension 5 5  Ankle dorsiflexion 4 4  Ankle plantarflexion    Ankle inversion    Ankle eversion     (Blank rows = not tested)  FUNCTIONAL TESTS:  5 times sit to stand: 15.8 seconds Berg Balance Scale: 39/56 (increased fall risk)  GAIT: Distance walked: 200 feet in clinic/outside Assistive device utilized: None Level of assistance: Complete Independence Comments: Reciprocal gait pattern, slow gait speed, decreased step length bilaterally, narrow BoS   FOTO: initial 46/ goal 53  TODAY'S TREATMENT:                                                                                                                              DATE: 12/11/2022  Subjective: Pt reports doing well this morning; says his back was bothering him some last night and it is around a 3-4/10 right now.    Neuro.mm.: Walking in // bars over blue mat with ankle weights underneath for uneven  terrain: 3 laps each of walking forward, lateral, backwards; added reaching outside BoS to grab cones off ground at end of fwd/back walks - no instances of LoB, CGA throughout for safety  Dynamic Slow Marches in // bars: VC's for core engagement, sequencing; pt struggled with sequencing opposite arm/leg  Alternating foot taps onto cones while standing on foam pad: 2x10 each foot, no UE support - CGA  FGA exercises outside in grass/uneven terrain: 4 laps of walking with head turns/visual scanning, reactive gait (quick stop/start, alternating speeds, turns, changing directions) - CGA  Tandem walks in // bars: 4 laps - mild LoB requiring UE support to correct; pt struggles the most with this exercise  Walking in hallway with ball tosses, added tosses L/R to add trunk rotation - a few instances of mild losses of balance requiring stepping reaction to maintain balance  There.ex.:  Nustep x 10 mins to improve cardiovascular endurance and LE strength: Level 4, seat 11 with B LE, R UE.    Nautilus: resisted gait 70# 5x all 4-planes - CGA for safety; occasional VC's for core engagement  Sit to stands on foam pad: 2x8, in // bars for safety but no UE support needed  Not today: Reaching outside BoS tasks: bending over to pick up cone off ground with one LE on foam pad, x5 pick-ups each leg - CGA for safety Standing perturbation training at // bars: black TB around waist with PT providing anterior/posterior perturbations, alternating eyes open/closed, 1 x 2-3 minute bouts each direction Romberg Stance on foam: 2 x 30 second holds each of vertical/horizontal head turns, eyes open/closed Tandem stance on foam: 1 x 1 minute holds each foot forward - VC's for keeping feet flat on ground Walking cone taps at agility ladder with CGA.  Pt. Challenged with several episodes of knocking over cone.   Lateral walking at agility ladder with cone taps.  Fwd Step-ups at 6" step with no UE support: 2 x 10 each  leg  PATIENT EDUCATION:  Education details: Use of SPC with transfers, ambulation; initial HEP Person educated: Patient Education method: Medical illustrator Education comprehension: verbalized understanding and returned demonstration  HOME EXERCISE PROGRAM: Access Code: [email or web address] URL: [email or web address] Date: 11/13/2022 Prepared by: Dorene Grebe  Exercises - Sit to Stand Without Arm Support  - 2 x daily - 7 x weekly - 1 sets - 10 reps - Standing Tandem Balance with Counter Support  - 2 x daily - 7 x weekly - 1 sets - 10 reps - Narrow Stance with Unilateral Counter Support  - 2 x daily - 7 x weekly - 1 sets - 10 reps  ASSESSMENT:  CLINICAL IMPRESSION: Pt. reports to PT with reports of usual low back pain (3-4/10). Session today consisted of exercises aimed to work on maintaining balance while reaching outside BoS, changing speeds/directions while ambulating, walking with narrow BoS. Session also consisted of exercises aimed to improve LE and core strength. Pt requires CGA throughout all exercises for safety with occasional mild losses of balance however is able to self-correct each time. Most losses of balance occur with tandem walks and walking with ball tosses. Pt will continue to benefit from skilled PT services to improve functional independence and return to PLOF.    OBJECTIVE IMPAIRMENTS: decreased activity tolerance, decreased balance, decreased knowledge of use of DME, difficulty walking, decreased strength, decreased safety awareness, and impaired sensation.   ACTIVITY LIMITATIONS: standing and squatting  PARTICIPATION LIMITATIONS: community activity  PERSONAL FACTORS: Age, Time since onset of injury/illness/exacerbation, and 3+ comorbidities: Vestibular Schwannoma, diabetic charcot foot, peripheral neuropathy, LBP  are also affecting patient's functional outcome.   REHAB POTENTIAL: Fair to Good  CLINICAL DECISION MAKING:  Stable/uncomplicated  EVALUATION COMPLEXITY: Low  GOALS:  SHORT TERM GOALS: Target date: 12/04/2022 Pt will complete FOTO next visit. Baseline: N/A.  8/8: initial 46/ goal 53 Goal status: Goal met  2.  Pt will  be independent and compliant with HEP in order to improve bilateral hip strength and static balance. Baseline: See above for MMT scores/balance scores Goal status: GOAL MET, see above for updated MMT and balance assessment scores   LONG TERM GOALS: Target date: 12/25/2022  Pt will decrease 5TSTS by at least 3 seconds in order to demonstrate decreased risk for falls. Baseline: 15.8 seconds; (12/04/2022) 13.01 seconds Goal status: INITIAL  2.  Pt will improve BERG to at least 45/56 to demonstrate improved static and dynamic balance and decreased risk for falls.  Baseline: 39/56; (12/04/2022) 47/56 Goal status: INITIAL  3.  Pt will improve LE MMT scores to at least 4+/5 bilaterally to demonstrate improved LE strength. Baseline: See above Goal status: INITIAL  4.  Pt will increase FOTO score to goal to demonstrate improved balance and performance of ADL's/IADL's. Baseline: TBD Goal status: INITIAL   PLAN:  PT FREQUENCY: 2x/week  PT DURATION: 6 weeks  PLANNED INTERVENTIONS: Therapeutic exercises, Therapeutic activity, Neuromuscular re-education, Balance training, Gait training, Patient/Family education, Self Care, Cryotherapy, and Moist heat  PLAN FOR NEXT SESSION: Progress LE/core strengthening exercises, progress static/dynamic balance exercises, resisted gait + more FGA tasks on uneven terrain; progress perturbation/reaching/bending exercises, modify/progress HEP  Cammie Mcgee, PT, DPT # [phone] Cena Benton, SPT 12/11/22, 8:19 AM

## 2022-12-13 ENCOUNTER — Ambulatory Visit: Payer: PPO | Admitting: Physical Therapy

## 2022-12-13 ENCOUNTER — Encounter: Payer: Self-pay | Admitting: Physical Therapy

## 2022-12-13 DIAGNOSIS — R269 Unspecified abnormalities of gait and mobility: Secondary | ICD-10-CM

## 2022-12-13 DIAGNOSIS — M6281 Muscle weakness (generalized): Secondary | ICD-10-CM

## 2022-12-13 DIAGNOSIS — R2689 Other abnormalities of gait and mobility: Secondary | ICD-10-CM | POA: Diagnosis not present

## 2022-12-13 NOTE — Therapy (Signed)
OUTPATIENT PHYSICAL THERAPY LOWER EXTREMITY TREATMENT   Patient Name: Taylor Ross MRN: [id number] DOB:07-27-1945, 77 y.o., male Today's Date: 12/13/2022  END OF SESSION:  PT End of Session - 12/13/22 0857     Visit Number 9    Number of Visits 12    Date for PT Re-Evaluation 12/25/22    PT Start Time 0844    PT Stop Time 0940    PT Time Calculation (min) 56 min    Equipment Utilized During Treatment Gait belt    Activity Tolerance Patient tolerated treatment well    Behavior During Therapy WFL for tasks assessed/performed               Past Medical History:  Diagnosis Date   Allergic rhinitis    Bacterial meningitis    Diabetes mellitus without complication (HCC)    Diabetic foot ulcer associated with type 2 diabetes mellitus (HCC)    GERD (gastroesophageal reflux disease)    Gout    Hyperlipidemia    Hypertension    Neuropathic diabetic ulcer of foot (HCC)    Schwannoma    Past Surgical History:  Procedure Laterality Date   BONE EXOSTOSIS EXCISION Right 08/12/2020   Procedure: EXOSTOSIS EXCISION- SADDLEBONE X2- RIGHT;  Surgeon: Gwyneth Revels, DPM;  Location: [facility] ORS;  Service: Podiatry;  Laterality: Right;   BRAIN SURGERY  2006 x 3; 2010 x 1   Benign Tumor Removal [facility])   COLON SURGERY  2008   Dr. Katrinka Blazing [facility])   COLONOSCOPY WITH PROPOFOL N/A 11/08/2015   Procedure: COLONOSCOPY WITH PROPOFOL;  Surgeon: Christena Deem, MD;  Location: [facility] ENDOSCOPY;  Service: Endoscopy;  Laterality: N/A;   COLONOSCOPY WITH PROPOFOL N/A 11/09/2015   Procedure: COLONOSCOPY WITH PROPOFOL;  Surgeon: Christena Deem, MD;  Location: [facility] ENDOSCOPY;  Service: Endoscopy;  Laterality: N/A;   COLONOSCOPY WITH PROPOFOL N/A 10/21/2018   Procedure: COLONOSCOPY WITH PROPOFOL;  Surgeon: Christena Deem, MD;  Location: [facility] ENDOSCOPY;  Service: Endoscopy;  Laterality: N/A;   FLEXIBLE BRONCHOSCOPY N/A 10/04/2015   Procedure: FLEXIBLE BRONCHOSCOPY;  Surgeon: Merwyn Katos,  MD;  Location: [facility] ORS;  Service: Pulmonary;  Laterality: N/A;   Patient Active Problem List   Diagnosis Date Noted   Controlled type 2 diabetes mellitus without complication (HCC) 10/07/2015   Neurilemmoma 10/07/2015   History of colon polyps 10/07/2015   Alimentary obesity 10/07/2015   Combined fat and carbohydrate induced hyperlipemia 10/07/2015   H/O: gout 10/07/2015   ED (erectile dysfunction) of organic origin 10/07/2015   Allergic rhinitis 10/07/2015   Chronic kidney disease (CKD), stage III (moderate) (HCC) 03/23/2015   H/O meningitis 09/08/2006    PCP: Marisue Ivan, MD  REFERRING PROVIDER: Lonell Face, MD  REFERRING DIAG: Balance problem  THERAPY DIAG:  Balance problems  Gait difficulty  Muscle weakness (generalized)  Balance problem  Rationale for Evaluation and Treatment: Rehabilitation  ONSET DATE: 08/12/2020  SUBJECTIVE:   SUBJECTIVE STATEMENT: Pt reports initial onset of balance issues in 2006 when he had 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 He reports his balance has continued to get worse over time, particularly with immediate standing and gait initiation. Pt denies any dizziness/lightheadedness, syncope; denies falls in the last 6 months although has had numerous "near falls." Pt also reports low back pain (R>L) that typically worsens towards the end of the day.  PERTINENT HISTORY: R vestibular schwannoma, peripheral neuropathy, alcohol use, pre-diabetes PAIN:  Are you having pain? Yes: NPRS scale: 3/10 Pain location: R low back Pain description: ache Aggravating factors: activity Relieving factors: Rest  PRECAUTIONS: Fall  RED FLAGS: None   WEIGHT BEARING RESTRICTIONS: No  FALLS:  Has patient fallen in last 6 months? No  LIVING ENVIRONMENT: Lives with: lives with their spouse Lives in: House/apartment Stairs: Yes: External: 4 steps; bilateral but cannot  reach both Has following equipment at home: Single point cane  OCCUPATION: Retired  PLOF: Independent  PATIENT GOALS: Get back to walking  NEXT MD VISIT: TBD  OBJECTIVE:   DIAGNOSTIC FINDINGS: N/A  PATIENT SURVEYS:  FOTO TBD next visit  COGNITION: Overall cognitive status: Within functional limits for tasks assessed     SENSATION: Light touch: Impaired  (Numbness bilaterally at level of malleoli and distally)   POSTURE: rounded shoulders   LOWER EXTREMITY ROM:  Active ROM Right eval Left eval  Hip flexion [facility]  Hip extension    Hip abduction [facility]  Hip adduction    Hip internal rotation [facility] WFL  Hip external rotation [facility]  Knee flexion    Knee extension [facility] WFL  Ankle dorsiflexion [facility] WFL  Ankle plantarflexion    Ankle inversion    Ankle eversion     (Blank rows = not tested)  LOWER EXTREMITY MMT:  MMT Right eval Left eval  Hip flexion 4 4  Hip extension    Hip abduction 4 4  Hip adduction    Hip internal rotation 5 5  Hip external rotation 4+ 4+  Knee flexion 4 4-  Knee extension 5 5  Ankle dorsiflexion 4 4  Ankle plantarflexion    Ankle inversion    Ankle eversion     (Blank rows = not tested)  FUNCTIONAL TESTS:  5 times sit to stand: 15.8 seconds Berg Balance Scale: 39/56 (increased fall risk)  GAIT: Distance walked: 200 feet in clinic/outside Assistive device utilized: None Level of assistance: Complete Independence Comments: Reciprocal gait pattern, slow gait speed, decreased step length bilaterally, narrow BoS  FOTO: initial 46/ goal 53   TODAY'S TREATMENT:                                                                                                                              DATE: 12/13/2022  Subjective: Pt. reports doing well this morning.  Pt. Had f/u with Podiatrist after last PT tx. Session and had callus shaved down.  Minimal c/o foot discomfort prior to tx. Session but better than yesterday.     There.ex.:  Nustep x 10 mins to improve cardiovascular endurance and LE strength: Level 4, seat 11 with B UE/LE (no increase c/o shoulder pain).     Nautilus: resisted gait 70# 5x all 4-planes - CGA for safety; occasional VC's for core engagement.   Neuro.mm.:  Standing foot taps at stairs with light to no UE assist (focus on wt. Shifting/ hip adduction).  CGA for safety/ cuing.  Standing in //-bars without shoes working on NBOS/ tandem stance/ EO/ EC.  Improved balance with shoes off.    FGA exercises outside in grass/uneven terrain: 4 laps of walking with head turns/visual scanning, reactive gait (quick stop/start, alternating speeds, turns, changing directions) - CGA    Walking in //-bars with 6" hurdles/ sit disc/ Airex/ cone pick ups (CGA for safety).      PATIENT EDUCATION:  Education details: Use of SPC with transfers, ambulation; initial HEP Person educated: Patient Education method: Medical illustrator Education comprehension: verbalized understanding and returned demonstration  HOME EXERCISE PROGRAM: Access Code: [email or web address] URL: [email or web address] Date: 11/13/2022 Prepared by: Dorene Grebe  Exercises - Sit to Stand Without Arm Support  - 2 x daily - 7 x weekly - 1 sets - 10 reps - Standing Tandem Balance with Counter Support  - 2 x daily - 7 x weekly - 1 sets - 10 reps - Narrow Stance with Unilateral Counter Support  - 2 x daily - 7 x weekly - 1 sets - 10 reps  ASSESSMENT:  CLINICAL IMPRESSION: Tx. session today focused on exercises aimed to work on maintaining balance while reaching outside BoS, changing speeds/directions while ambulating, walking with narrow BoS. Session also consisted of exercises aimed to improve LE and core strength.  Pt. Benefits from standing/ dynamic balance tasks without shoes while in //-bar.  Pt requires CGA throughout all exercises for safety with occasional mild losses of balance however is able to  self-correct each time.  No LOB today. Pt will continue to benefit from skilled PT services to improve functional independence and return to PLOF.    OBJECTIVE IMPAIRMENTS: decreased activity tolerance, decreased balance, decreased knowledge of use of DME, difficulty walking, decreased strength, decreased safety awareness, and impaired sensation.   ACTIVITY LIMITATIONS: standing and squatting  PARTICIPATION LIMITATIONS: community activity  PERSONAL FACTORS: Age, Time since onset of injury/illness/exacerbation, and 3+ comorbidities: Vestibular Schwannoma, diabetic charcot foot, peripheral neuropathy, LBP  are also affecting patient's functional outcome.   REHAB POTENTIAL: Fair to Good  CLINICAL DECISION MAKING: Stable/uncomplicated  EVALUATION COMPLEXITY: Low  GOALS:  SHORT TERM GOALS: Target date: 12/04/2022 Pt will complete FOTO next visit. Baseline: N/A.  8/8: initial 46/ goal 53 Goal status: Goal met  2.  Pt will be independent and compliant with HEP in order to improve bilateral hip strength and static balance. Baseline: See above for MMT scores/balance scores Goal status: GOAL MET, see above for updated MMT and balance assessment scores   LONG TERM GOALS: Target date: 12/25/2022  Pt will decrease 5TSTS by at least 3 seconds in order to demonstrate decreased risk for falls. Baseline: 15.8 seconds; (12/04/2022) 13.01 seconds Goal status: INITIAL  2.  Pt will improve BERG to at least 45/56 to demonstrate improved static and dynamic balance and decreased risk for falls.  Baseline: 39/56; (12/04/2022) 47/56 Goal status: INITIAL  3.  Pt will improve LE MMT scores to at least 4+/5 bilaterally to demonstrate improved LE strength. Baseline: See above Goal status: INITIAL  4.  Pt will increase FOTO score to goal to demonstrate improved balance and performance of ADL's/IADL's. Baseline: TBD Goal status: INITIAL   PLAN:  PT FREQUENCY: 2x/week  PT DURATION: 6 weeks  PLANNED  INTERVENTIONS: Therapeutic exercises, Therapeutic activity, Neuromuscular re-education, Balance training, Gait training, Patient/Family education, Self Care, Cryotherapy, and Moist heat  PLAN FOR NEXT SESSION: Progress LE/core strengthening exercises, CHECK GOALS/ 10th visit progress note  Cammie Mcgee, PT, DPT # [phone] Cena Benton,  SPT 12/13/22, 9:41 AM

## 2022-12-18 ENCOUNTER — Ambulatory Visit: Payer: PPO | Admitting: Physical Therapy

## 2022-12-18 ENCOUNTER — Encounter: Payer: Self-pay | Admitting: Physical Therapy

## 2022-12-18 DIAGNOSIS — R2689 Other abnormalities of gait and mobility: Secondary | ICD-10-CM

## 2022-12-18 DIAGNOSIS — R269 Unspecified abnormalities of gait and mobility: Secondary | ICD-10-CM

## 2022-12-18 DIAGNOSIS — M6281 Muscle weakness (generalized): Secondary | ICD-10-CM

## 2022-12-18 NOTE — Therapy (Unsigned)
OUTPATIENT PHYSICAL THERAPY LOWER EXTREMITY TREATMENT Physical Therapy Progress Note  Dates of reporting period  11/13/22   to   12/18/22    Patient Name: Taylor Ross MRN: [id number] DOB:Aug 18, 1945, 77 y.o., male Today's Date: 12/18/2022  END OF SESSION:  PT End of Session - 12/18/22 0846     Visit Number 10    Number of Visits 12    Date for PT Re-Evaluation 12/25/22    PT Start Time 0846    PT Stop Time 0935    PT Time Calculation (min) 49 min    Equipment Utilized During Treatment Gait belt    Activity Tolerance Patient tolerated treatment well    Behavior During Therapy WFL for tasks assessed/performed             Past Medical History:  Diagnosis Date   Allergic rhinitis    Bacterial meningitis    Diabetes mellitus without complication (HCC)    Diabetic foot ulcer associated with type 2 diabetes mellitus (HCC)    GERD (gastroesophageal reflux disease)    Gout    Hyperlipidemia    Hypertension    Neuropathic diabetic ulcer of foot (HCC)    Schwannoma    Past Surgical History:  Procedure Laterality Date   BONE EXOSTOSIS EXCISION Right 08/12/2020   Procedure: EXOSTOSIS EXCISION- SADDLEBONE X2- RIGHT;  Surgeon: Gwyneth Revels, DPM;  Location: [facility] ORS;  Service: Podiatry;  Laterality: Right;   BRAIN SURGERY  2006 x 3; 2010 x 1   Benign Tumor Removal [facility]   COLON SURGERY  2008   Dr. Katrinka [facility]azing [facility])   COLONOSCOPY WITH PROPOFOL N/A 11/08/2015   Procedure: COLONOSCOPY WITH PROPOFOL;  Surgeon: Christena Deem, MD;  [facility] ENDOSCOPY;  Service: Endoscopy;  Laterality: N/A;   COLONOSCOPY WITH PROPOFOL N/A 11/09/2015   Procedure: COLONOSCOPY WITH PROPOFOL;  Surgeon: Christena Deem, MD;  Location: [facility] ENDOSCOPY;  Service: Endoscopy;  Laterality: N/A;   COLONOSCOPY WITH PROPOFOL N/A 10/21/2018   Procedure: COLONOSCOPY WITH PROPOFOL;  Surgeon: Christena Deem, MD;  Location: [facility] ENDOSCOPY;  Service: Endoscopy;  Laterality: N/A;   FLEXIBLE  BRONCHOSCOPY N/A 10/04/2015   Procedure: FLEXIBLE BRONCHOSCOPY;  Surgeon: Merwyn Katos, MD;  Location: [facility] ORS;  Service: Pulmonary;  Laterality: N/A;   Patient Active Problem List   Diagnosis Date Noted   Controlled type 2 diabetes mellitus without complication (HCC) 10/07/2015   Neurilemmoma 10/07/2015   History of colon polyps 10/07/2015   Alimentary obesity 10/07/2015   Combined fat and carbohydrate induced hyperlipemia 10/07/2015   H/O: gout 10/07/2015   ED (erectile dysfunction) of organic origin 10/07/2015   Allergic rhinitis 10/07/2015   Chronic kidney disease (CKD), stage III (moderate) (HCC) 03/23/2015   H/O meningitis 09/08/2006    PCP: Marisue Ivan, MD  REFERRING PROVIDER: Lonell Face, MD  REFERRING DIAG: Balance problem  THERAPY DIAG:  Balance problems  Gait difficulty  Muscle weakness (generalized)  Balance problem  Rationale for Evaluation and Treatment: Rehabilitation  ONSET DATE: 08/12/2020  SUBJECTIVE:   SUBJECTIVE STATEMENT: Pt reports initial onset of balance issues in 2006 when he had 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 He reports his balance has continued to get worse over time, particularly with immediate standing and gait initiation. Pt denies any dizziness/lightheadedness, syncope; denies falls in the last 6 months although has had numerous "near falls." Pt also reports low back pain (R>L) that typically worsens towards the end  of the day.  PERTINENT HISTORY: R vestibular schwannoma, peripheral neuropathy, alcohol use, pre-diabetes PAIN:  Are you having pain? Yes: NPRS scale: 3/10 Pain location: R low back Pain description: ache Aggravating factors: activity Relieving factors: Rest  PRECAUTIONS: Fall  RED FLAGS: None   WEIGHT BEARING RESTRICTIONS: No  FALLS:  Has patient fallen in last 6 months? No  LIVING ENVIRONMENT: Lives with: lives with their  spouse Lives in: House/apartment Stairs: Yes: External: 4 steps; bilateral but cannot reach both Has following equipment at home: Single point cane  OCCUPATION: Retired  PLOF: Independent  PATIENT GOALS: Get back to walking  NEXT MD VISIT: TBD  OBJECTIVE:   DIAGNOSTIC FINDINGS: N/A  PATIENT SURVEYS:  FOTO TBD next visit  COGNITION: Overall cognitive status: Within functional limits for tasks assessed     SENSATION: Light touch: Impaired  (Numbness bilaterally at level of malleoli and distally)   POSTURE: rounded shoulders   LOWER EXTREMITY ROM:  Active ROM Right eval Left eval  Hip flexion [facility]  Hip extension    Hip abduction [facility]  Hip adduction    Hip internal rotation [facility] WFL  Hip external rotation [facility]  Knee flexion    Knee extension [facility] WFL  Ankle dorsiflexion [facility] WFL  Ankle plantarflexion    Ankle inversion    Ankle eversion     (Blank rows = not tested)  LOWER EXTREMITY MMT:  MMT Right eval Left eval  Hip flexion 4 4  Hip extension    Hip abduction 4 4  Hip adduction    Hip internal rotation 5 5  Hip external rotation 4+ 4+  Knee flexion 4 4-  Knee extension 5 5  Ankle dorsiflexion 4 4  Ankle plantarflexion    Ankle inversion    Ankle eversion     (Blank rows = not tested)  FUNCTIONAL TESTS:  5 times sit to stand: 15.8 seconds Berg Balance Scale: 39/56 (increased fall risk)  GAIT: Distance walked: 200 feet in clinic/outside Assistive device utilized: None Level of assistance: Complete Independence Comments: Reciprocal gait pattern, slow gait speed, decreased step length bilaterally, narrow BoS  FOTO: initial 46/ goal 53   TODAY'S TREATMENT:                                                                                                                              DATE: 12/18/2022  Subjective: Pt. reports doing well this morning. He says he tripped over something over the weekend and fell, skinning both of his knees. He  reports feeling fine otherwise; no pain this morning.   There.ex.: 5XSTS: 13.5 seconds first attempt, 12.05 seconds second attempt  BERG Balance: 46/56  LOWER EXTREMITY MMT:    MMT Right eval Left eval  Hip flexion 4+ 4+  Hip extension    Hip abduction 4 4  Hip adduction    Hip internal rotation 5 5  Hip external rotation 5 5  Knee  flexion 5 5  Knee extension 5 5  Ankle dorsiflexion 5 5  Ankle plantarflexion    Ankle inversion    Ankle eversion     (Blank rows = not tested)   Nustep x 10 mins to improve cardiovascular endurance and LE strength: Level 4, seat 11 with B UE/LE (no increase c/o shoulder pain).     Nautilus: resisted gait 70# 5x all 4-planes - CGA for safety; occasional VC's for core engagement.   Neuro.mm.:  Marching cone taps in // bars: 4 laps with minimal UE assist - CGA for safety  Tandem walk on long foam pad followed by functional reach task on foam pad for cones on the ground: 4 laps with 3 cones to reach for at end   PATIENT EDUCATION:  Education details: Use of SPC with transfers, ambulation; initial HEP Person educated: Patient Education method: Medical illustrator Education comprehension: verbalized understanding and returned demonstration  HOME EXERCISE PROGRAM: Access Code: [email or web address] URL: [email or web address] Date: 11/13/2022 Prepared by: Dorene Grebe  Exercises - Sit to Stand Without Arm Support  - 2 x daily - 7 x weekly - 1 sets - 10 reps - Standing Tandem Balance with Counter Support  - 2 x daily - 7 x weekly - 1 sets - 10 reps - Narrow Stance with Unilateral Counter Support  - 2 x daily - 7 x weekly - 1 sets - 10 reps  ASSESSMENT:  CLINICAL IMPRESSION: Pt presents to PT for re-evaluation today. Pt demonstrated clinically significant improvement in 5XSTS score compared to initial evaluation. Pt also demonstrated improvement in BERG balance test score compared to initial evaluation and is now above the  cutoff score for fall-risk. Pt's LE strength showed improvements as well, particularly with knee flexion and ankle dorsiflexion. Pt reports subjective improvement in function at home particularly with yardwork and bending over to pick objects up off the ground. The rest of the session consisted of dynamic balance exercises as well as resisted gait to continue improving patient's function and strength with ambulation. Pt will continue to benefit from skilled PT services to improve functional independence and return to PLOF.    OBJECTIVE IMPAIRMENTS: decreased activity tolerance, decreased balance, decreased knowledge of use of DME, difficulty walking, decreased strength, decreased safety awareness, and impaired sensation.   ACTIVITY LIMITATIONS: standing and squatting  PARTICIPATION LIMITATIONS: community activity  PERSONAL FACTORS: Age, Time since onset of injury/illness/exacerbation, and 3+ comorbidities: Vestibular Schwannoma, diabetic charcot foot, peripheral neuropathy, LBP  are also affecting patient's functional outcome.   REHAB POTENTIAL: Fair to Good  CLINICAL DECISION MAKING: Stable/uncomplicated  EVALUATION COMPLEXITY: Low  GOALS:  SHORT TERM GOALS: Target date: 12/04/2022 Pt will complete FOTO next visit. Baseline: N/A.  8/8: initial 46/ goal 53 Goal status: Goal met  2.  Pt will be independent and compliant with HEP in order to improve bilateral hip strength and static balance. Baseline: See above for MMT scores/balance scores Goal status: GOAL MET, see above for updated MMT and balance assessment scores   LONG TERM GOALS: Target date: 12/25/2022  Pt will decrease 5TSTS by at least 3 seconds in order to demonstrate decreased risk for falls. Baseline: 15.8 seconds; (12/04/2022) 13.01 seconds, 12.05 seconds (12/18/2022) Goal status: GOAL MET  2.  Pt will improve BERG to at least 45/56 to demonstrate improved static and dynamic balance and decreased risk for falls.  Baseline:  39/56; (12/04/2022) 47/56, 46/56 (12/18/2022) Goal status: GOAL MET  3.  Pt will improve LE MMT scores to at  least 4+/5 bilaterally to demonstrate improved LE strength. Baseline: See above for updated scores (12/18/2022) Goal status: Partially met  4.  Pt will increase FOTO score to goal to demonstrate improved balance and performance of ADL's/IADL's. Baseline: TBD Goal status: On-going   PLAN:  PT FREQUENCY: 2x/week  PT DURATION: 6 weeks  PLANNED INTERVENTIONS: Therapeutic exercises, Therapeutic activity, Neuromuscular re-education, Balance training, Gait training, Patient/Family education, Self Care, Cryotherapy, and Moist heat  PLAN FOR NEXT SESSION: Progress LE/core strengthening exercises, CHECK FOTO   Cammie Mcgee, PT, DPT # [phone] Cena Benton, SPT 12/18/22, 9:46 AM

## 2022-12-20 ENCOUNTER — Encounter: Payer: Self-pay | Admitting: Physical Therapy

## 2022-12-20 ENCOUNTER — Ambulatory Visit: Payer: PPO | Admitting: Physical Therapy

## 2022-12-20 DIAGNOSIS — R2689 Other abnormalities of gait and mobility: Secondary | ICD-10-CM

## 2022-12-20 DIAGNOSIS — M6281 Muscle weakness (generalized): Secondary | ICD-10-CM

## 2022-12-20 DIAGNOSIS — R269 Unspecified abnormalities of gait and mobility: Secondary | ICD-10-CM

## 2022-12-20 NOTE — Therapy (Signed)
OUTPATIENT PHYSICAL THERAPY LOWER EXTREMITY TREATMENT   Patient Name: Taylor Ross MRN: [id number] DOB:[birth year], 77 y.o., male Today's Date: 12/21/2022  END OF SESSION:  PT End of Session - 12/20/22 0853     Visit Number 11    Number of Visits 12    Date for PT Re-Evaluation 12/25/22    PT Start Time 0850    PT Stop Time 0935    PT Time Calculation (min) 45 min    Equipment Utilized During Treatment Gait belt    Activity Tolerance Patient tolerated treatment well    Behavior During Therapy WFL for tasks assessed/performed              Past Medical History:  Diagnosis Date   Allergic rhinitis    Bacterial meningitis    Diabetes mellitus without complication (HCC)    Diabetic foot ulcer associated with type 2 diabetes mellitus (HCC)    GERD (gastroesophageal reflux disease)    Gout    Hyperlipidemia    Hypertension    Neuropathic diabetic ulcer of foot (HCC)    Schwannoma    Past Surgical History:  Procedure Laterality Date   BONE EXOSTOSIS EXCISION Right 08/12/2020   Procedure: EXOSTOSIS EXCISION- SADDLEBONE X2- RIGHT;  Surgeon: Gwyneth Revels, DPM;  Location: [facility] ORS;  Service: Podiatry;  Laterality: Right;   BRAIN SURGERY  2006 x 3; 2010 x 1   Benign Tumor Removal [facility])   COLON SURGERY  2008   Dr. Katrinka Blazing [facility])   COLONOSCOPY WITH PROPOFOL N/A 11/08/2015   Procedure: COLONOSCOPY WITH PROPOFOL;  Surgeon: Christena Deem, MD;  Location[facility] ENDOSCOPY;  Service: Endoscopy;  Laterality: N/A;   COLONOSCOPY WITH PROPOFOL N/A 11/09/2015   Procedure: COLONOSCOPY WITH PROPOFOL;  Surgeon: Christena Deem, MD;  Location: [facility] ENDOSCOPY;  Service: Endoscopy;  Laterality: N/A;   COLONOSCOPY WITH PROPOFOL N/A 10/21/2018   Procedure: COLONOSCOPY WITH PROPOFOL;  Surgeon: Christena Deem, MD;  Location: [facility] ENDOSCOPY;  Service: Endoscopy;  Laterality: N/A;   FLEXIBLE BRONCHOSCOPY N/A 10/04/2015   Procedure: FLEXIBLE BRONCHOSCOPY;  Surgeon: Merwyn Katos,  MD;  Location: [facility] ORS;  Service: Pulmonary;  Laterality: N/A;   Patient Active Problem List   Diagnosis Date Noted   Controlled type 2 diabetes mellitus without complication (HCC) 10/07/2015   Neurilemmoma 10/07/2015   History of colon polyps 10/07/2015   Alimentary obesity 10/07/2015   Combined fat and carbohydrate induced hyperlipemia 10/07/2015   H/O: gout 10/07/2015   ED (erectile dysfunction) of organic origin 10/07/2015   Allergic rhinitis 10/07/2015   Chronic kidney disease (CKD), stage III (moderate) (HCC) 03/23/2015   H/O meningitis 09/08/2006    PCP: Marisue Ivan, MD  REFERRING PROVIDER: Lonell Face, MD  REFERRING DIAG: Balance problem  THERAPY DIAG:  Balance problems  Gait difficulty  Muscle weakness (generalized)  Balance problem  Rationale for Evaluation and Treatment: Rehabilitation  ONSET DATE: 08/12/2020  SUBJECTIVE:   SUBJECTIVE STATEMENT: Pt reports initial onset of balance issues in 2006 when he had 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 He reports his balance has continued to get worse over time, particularly with immediate standing and gait initiation. Pt denies any dizziness/lightheadedness, syncope; denies falls in the last 6 months although has had numerous "near falls." Pt also reports low back pain (R>L) that typically worsens towards the end of the day.  PERTINENT HISTORY: R vestibular schwannoma, peripheral neuropathy, alcohol use, pre-diabetes PAIN:  Are  you having pain? Yes: NPRS scale: 3/10 Pain location: R low back Pain description: ache Aggravating factors: activity Relieving factors: Rest  PRECAUTIONS: Fall  RED FLAGS: None   WEIGHT BEARING RESTRICTIONS: No  FALLS:  Has patient fallen in last 6 months? No  LIVING ENVIRONMENT: Lives with: lives with their spouse Lives in: House/apartment Stairs: Yes: External: 4 steps; bilateral but cannot  reach both Has following equipment at home: Single point cane  OCCUPATION: Retired  PLOF: Independent  PATIENT GOALS: Get back to walking  NEXT MD VISIT: TBD  OBJECTIVE:   DIAGNOSTIC FINDINGS: N/A  PATIENT SURVEYS:  FOTO TBD next visit  COGNITION: Overall cognitive status: Within functional limits for tasks assessed     SENSATION: Light touch: Impaired  (Numbness bilaterally at level of malleoli and distally)   POSTURE: rounded shoulders   LOWER EXTREMITY ROM:  Active ROM Right eval Left eval  Hip flexion [facility]  Hip extension    Hip abduction [facility]  Hip adduction    Hip internal rotation [facility] WFL  Hip external rotation [facility]  Knee flexion    Knee extension [facility] WFL  Ankle dorsiflexion [facility] WFL  Ankle plantarflexion    Ankle inversion    Ankle eversion     (Blank rows = not tested)  LOWER EXTREMITY MMT:  MMT Right eval Left eval  Hip flexion 4 4  Hip extension    Hip abduction 4 4  Hip adduction    Hip internal rotation 5 5  Hip external rotation 4+ 4+  Knee flexion 4 4-  Knee extension 5 5  Ankle dorsiflexion 4 4  Ankle plantarflexion    Ankle inversion    Ankle eversion     (Blank rows = not tested)  FUNCTIONAL TESTS:  5 times sit to stand: 15.8 seconds Berg Balance Scale: 39/56 (increased fall risk)  GAIT: Distance walked: 200 feet in clinic/outside Assistive device utilized: None Level of assistance: Complete Independence Comments: Reciprocal gait pattern, slow gait speed, decreased step length bilaterally, narrow BoS  FOTO: initial 46/ goal 53   TODAY'S TREATMENT:                                                                                                                              DATE: 12/21/2022  Subjective: Pt. reports doing well this morning; his back and knees are still a little sore from his fall over the weekend. He says he has not done his exercises the last few days because he was letting his back/knees  rest.  There.ex.:  Nustep x 10 mins to improve cardiovascular endurance and LE strength: Level 4, seat 11 with B UE/LE (no increase c/o shoulder pain).     Nautilus: resisted gait 70# 5x all 4-planes - CGA for safety; occasional VC's for core engagement.  Neuro.mm.:  Marching cone taps in // bars: 4 laps with minimal UE assist - CGA for safety  Tandem walk on long  foam pad followed by functional reach task on foam pad for cones on the ground: 4 laps with 4 cones to reach for at end  Tandem stance on foam pad with added functional reach for cones at chest-level: 2 bouts each of grabbing/placing 8 cones with each foot forward  FGA exercises outside on grass: alternating speeds, direction changes, head turns, eyes closed, backwards/lateral walking; 4 down/backs in grass outside  PATIENT EDUCATION:  Education details: Use of SPC with transfers, ambulation; initial HEP Person educated: Patient Education method: Medical illustrator Education comprehension: verbalized understanding and returned demonstration  HOME EXERCISE PROGRAM: Access Code: [email or web address] URL: [email or web address] Date: 11/13/2022 Prepared by: Dorene Grebe  Exercises - Sit to Stand Without Arm Support  - 2 x daily - 7 x weekly - 1 sets - 10 reps - Standing Tandem Balance with Counter Support  - 2 x daily - 7 x weekly - 1 sets - 10 reps - Narrow Stance with Unilateral Counter Support  - 2 x daily - 7 x weekly - 1 sets - 10 reps  ASSESSMENT:  CLINICAL IMPRESSION: Pt presents to PT with mild low back and bilateral knee pain as a result of his fall over the weekend. Session today consisted of exercises aimed to improve pt's static and dynamic balance while on uneven terrain, performing functional reaching tasks, changing directions/speeds. Pt continues to demonstrate greater instability when performing exercises with a narrow BoS. Pt's ankle also demonstrate greater sway and pt has to be cued  frequently to keep his feet flat on the ground to prevent inversion. CGA throughout all exercises for safety. Pt will continue to benefit from skilled PT services to improve functional independence and return to PLOF.    OBJECTIVE IMPAIRMENTS: decreased activity tolerance, decreased balance, decreased knowledge of use of DME, difficulty walking, decreased strength, decreased safety awareness, and impaired sensation.   ACTIVITY LIMITATIONS: standing and squatting  PARTICIPATION LIMITATIONS: community activity  PERSONAL FACTORS: Age, Time since onset of injury/illness/exacerbation, and 3+ comorbidities: Vestibular Schwannoma, diabetic charcot foot, peripheral neuropathy, LBP  are also affecting patient's functional outcome.   REHAB POTENTIAL: Fair to Good  CLINICAL DECISION MAKING: Stable/uncomplicated  EVALUATION COMPLEXITY: Low  GOALS:  SHORT TERM GOALS: Target date: 12/04/2022 Pt will complete FOTO next visit. Baseline: N/A.  8/8: initial 46/ goal 53 Goal status: Goal met  2.  Pt will be independent and compliant with HEP in order to improve bilateral hip strength and static balance. Baseline: See above for MMT scores/balance scores Goal status: GOAL MET, see above for updated MMT and balance assessment scores   LONG TERM GOALS: Target date: 12/25/2022  Pt will decrease 5TSTS by at least 3 seconds in order to demonstrate decreased risk for falls. Baseline: 15.8 seconds; (12/04/2022) 13.01 seconds, 12.05 seconds (12/18/2022) Goal status: GOAL MET  2.  Pt will improve BERG to at least 45/56 to demonstrate improved static and dynamic balance and decreased risk for falls.  Baseline: 39/56; (12/04/2022) 47/56, 46/56 (12/18/2022) Goal status: GOAL MET  3.  Pt will improve LE MMT scores to at least 4+/5 bilaterally to demonstrate improved LE strength. Baseline: See above for updated scores (12/18/2022) Goal status: Partially met  4.  Pt will increase FOTO score to goal to demonstrate  improved balance and performance of ADL's/IADL's. Baseline: TBD Goal status: On-going   PLAN:  PT FREQUENCY: 2x/week  PT DURATION: 6 weeks  PLANNED INTERVENTIONS: Therapeutic exercises, Therapeutic activity, Neuromuscular re-education, Balance training, Gait training, Patient/Family education, Self Care, Cryotherapy, and  Moist heat  PLAN FOR NEXT SESSION: Progress LE/core strengthening exercises, CHECK FOTO. Determine d/c plans.  Cammie Mcgee, PT, DPT # [phone] Cena Benton, SPT 12/21/22, 7:10 AM

## 2022-12-25 ENCOUNTER — Ambulatory Visit: Payer: PPO | Admitting: Physical Therapy

## 2022-12-25 ENCOUNTER — Encounter: Payer: Self-pay | Admitting: Physical Therapy

## 2022-12-25 DIAGNOSIS — R2689 Other abnormalities of gait and mobility: Secondary | ICD-10-CM

## 2022-12-25 DIAGNOSIS — R269 Unspecified abnormalities of gait and mobility: Secondary | ICD-10-CM

## 2022-12-25 DIAGNOSIS — M6281 Muscle weakness (generalized): Secondary | ICD-10-CM

## 2022-12-25 NOTE — Therapy (Signed)
OUTPATIENT PHYSICAL THERAPY LOWER EXTREMITY TREATMENT  Patient Name: Taylor Ross MRN: [id number] DOB:[birth year], 77 y.o., male Today's Date: 12/25/2022  END OF SESSION:  PT End of Session - 12/25/22 0907     Visit Number 12    Number of Visits 12    Date for PT Re-Evaluation 12/25/22    PT Start Time 0855    PT Stop Time 0945    PT Time Calculation (min) 50 min    Equipment Utilized During Treatment Gait belt    Activity Tolerance Patient tolerated treatment well    Behavior During Therapy WFL for tasks assessed/performed              Past Medical History:  Diagnosis Date   Allergic rhinitis    Bacterial meningitis    Diabetes mellitus without complication (HCC)    Diabetic foot ulcer associated with type 2 diabetes mellitus (HCC)    GERD (gastroesophageal reflux disease)    Gout    Hyperlipidemia    Hypertension    Neuropathic diabetic ulcer of foot (HCC)    Schwannoma    Past Surgical History:  Procedure Laterality Date   BONE EXOSTOSIS EXCISION Right 08/12/2020   Procedure: EXOSTOSIS EXCISION- SADDLEBONE X2- RIGHT;  Surgeon: Gwyneth Revels, DPM;  Location: [facility] ORS;  Service: Podiatry;  Laterality: Right;   BRAIN SURGERY  2006 x 3; 2010 x 1   Benign Tumor Removal [facility])   COLON SURGERY  2008   Dr. Katrinka Blazing [facility])   COLONOSCOPY WITH PROPOFOL N/A 11/08/2015   Procedure: COLONOSCOPY WITH PROPOFOL;  Surgeon: Christena Deem, MD;  Location: [facility] ENDOSCOPY;  Service: Endoscopy;  Laterality: N/A;   COLONOSCOPY WITH PROPOFOL N/A 11/09/2015   Procedure: COLONOSCOPY WITH PROPOFOL;  Surgeon: Christena Deem, MD;  Location: [facility] ENDOSCOPY;  Service: Endoscopy;  Laterality: N/A;   COLONOSCOPY WITH PROPOFOL N/A 10/21/2018   Procedure: COLONOSCOPY WITH PROPOFOL;  Surgeon: Christena Deem, MD;  [facility] ENDOSCOPY;  Service: Endoscopy;  Laterality: N/A;   FLEXIBLE BRONCHOSCOPY N/A 10/04/2015   Procedure: FLEXIBLE BRONCHOSCOPY;  Surgeon: Merwyn Katos, MD;   Location: [facility] ORS;  Service: Pulmonary;  Laterality: N/A;   Patient Active Problem List   Diagnosis Date Noted   Controlled type 2 diabetes mellitus without complication (HCC) 10/07/2015   Neurilemmoma 10/07/2015   History of colon polyps 10/07/2015   Alimentary obesity 10/07/2015   Combined fat and carbohydrate induced hyperlipemia 10/07/2015   H/O: gout 10/07/2015   ED (erectile dysfunction) of organic origin 10/07/2015   Allergic rhinitis 10/07/2015   Chronic kidney disease (CKD), stage III (moderate) (HCC) 03/23/2015   H/O meningitis 09/08/2006    PCP: Marisue Ivan, MD  REFERRING PROVIDER: Lonell Face, MD  REFERRING DIAG: Balance problem  THERAPY DIAG:  Balance problems  Gait difficulty  Muscle weakness (generalized)  Balance problem  Rationale for Evaluation and Treatment: Rehabilitation  ONSET DATE: 08/12/2020  SUBJECTIVE:   SUBJECTIVE STATEMENT: Pt reports initial onset of balance issues in 2006 when he had 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 He reports his balance has continued to get worse over time, particularly with immediate standing and gait initiation. Pt denies any dizziness/lightheadedness, syncope; denies falls in the last 6 months although has had numerous "near falls." Pt also reports low back pain (R>L) that typically worsens towards the end of the day.  PERTINENT HISTORY: R vestibular schwannoma, peripheral neuropathy, alcohol use, pre-diabetes PAIN:  Are you  having pain? Yes: NPRS scale: 3/10 Pain location: R low back Pain description: ache Aggravating factors: activity Relieving factors: Rest  PRECAUTIONS: Fall  RED FLAGS: None   WEIGHT BEARING RESTRICTIONS: No  FALLS:  Has patient fallen in last 6 months? No  LIVING ENVIRONMENT: Lives with: lives with their spouse Lives in: House/apartment Stairs: Yes: External: 4 steps; bilateral but cannot reach  both Has following equipment at home: Single point cane  OCCUPATION: Retired  PLOF: Independent  PATIENT GOALS: Get back to walking  NEXT MD VISIT: TBD  OBJECTIVE:   DIAGNOSTIC FINDINGS: N/A  PATIENT SURVEYS:  FOTO TBD next visit  COGNITION: Overall cognitive status: Within functional limits for tasks assessed     SENSATION: Light touch: Impaired  (Numbness bilaterally at level of malleoli and distally)   POSTURE: rounded shoulders   LOWER EXTREMITY ROM:  Active ROM Right eval Left eval  Hip flexion [facility]  Hip extension    Hip abduction [facility]  Hip adduction    Hip internal rotation [facility] WFL  Hip external rotation [facility]  Knee flexion    Knee extension [facility] WFL  Ankle dorsiflexion [facility] WFL  Ankle plantarflexion    Ankle inversion    Ankle eversion     (Blank rows = not tested)  LOWER EXTREMITY MMT:  MMT Right eval Left eval  Hip flexion 4 4  Hip extension    Hip abduction 4 4  Hip adduction    Hip internal rotation 5 5  Hip external rotation 4+ 4+  Knee flexion 4 4-  Knee extension 5 5  Ankle dorsiflexion 4 4  Ankle plantarflexion    Ankle inversion    Ankle eversion     (Blank rows = not tested)  FUNCTIONAL TESTS:  5 times sit to stand: 15.8 seconds Berg Balance Scale: 39/56 (increased fall risk)  GAIT: Distance walked: 200 feet in clinic/outside Assistive device utilized: None Level of assistance: Complete Independence Comments: Reciprocal gait pattern, slow gait speed, decreased step length bilaterally, narrow BoS  FOTO: initial 46/ goal 53   TODAY'S TREATMENT:                                                                                                                              DATE: 12/25/2022  Subjective: Pt. reports doing well this morning; his back and knees are still a little sore from his fall (R knee > L knee). R knee also appears to demonstrate some signs of infection.   There.ex.: FOTO: 54 (goal met)  Wound  Measurements on L knee: 8x8 cm redness  Nustep x 10 mins to improve cardiovascular endurance and LE strength: Level 4, seat 11 with B UE/LE (no increase c/o shoulder pain).     Nautilus: resisted gait 70# 5x all 4-planes - CGA for safety; occasional VC's for core engagement.  Neuro.mm.:  Alternating Cone Taps on 6 inch step: 2x1 minute bouts each foot  Tandem walk  on long foam pad followed by functional reach task on foam pad for cones on the ground: 4 laps with 4 cones to reach for at end  FGA exercises in hallway: alternating speeds, direction changes, head turns, eyes closed, backwards/lateral walking; 4 down/backs in grass outside - CGA for safety  Gait with functional reach task with cones: 2 laps in hallway each hand - CGA throughout  PATIENT EDUCATION:  Education details: Use of SPC with transfers, ambulation; initial HEP Person educated: Patient Education method: Explanation and Demonstration Education comprehension: verbalized understanding and returned demonstration  HOME EXERCISE PROGRAM: Access Code: [email or web address] URL: [email or web address] Date: 11/13/2022 Prepared by: Dorene Grebe  Exercises - Sit to Stand Without Arm Support  - 2 x daily - 7 x weekly - 1 sets - 10 reps - Standing Tandem Balance with Counter Support  - 2 x daily - 7 x weekly - 1 sets - 10 reps - Narrow Stance with Unilateral Counter Support  - 2 x daily - 7 x weekly - 1 sets - 10 reps  ASSESSMENT:  CLINICAL IMPRESSION: Pt presents to PT with mild low back and bilateral knee pain as a result of his fall last week. Pt's R knee wound from his fall last week appeared to show symptoms potentially consistent with infection; pt's wound bed was wet and a tan/yellowish color; pt had redness extending about 8 cm in each direction from the wound, will be assessed again next session to determine if redness is spreading or getting smaller. FOTO showed improvement today compared to initial evaluation.  Session today consisted of static and dynamic balance exercises to continue improving patient's stability when changing speeds, directions, walking with narrow BoS, walking on uneven terrain, etc. CGA throughout all exercises for safety. Pt agreeable to plan to discharge from PT next session. Pt will continue to benefit from skilled PT services to improve functional independence and return to PLOF.    OBJECTIVE IMPAIRMENTS: decreased activity tolerance, decreased balance, decreased knowledge of use of DME, difficulty walking, decreased strength, decreased safety awareness, and impaired sensation.   ACTIVITY LIMITATIONS: standing and squatting  PARTICIPATION LIMITATIONS: community activity  PERSONAL FACTORS: Age, Time since onset of injury/illness/exacerbation, and 3+ comorbidities: Vestibular Schwannoma, diabetic charcot foot, peripheral neuropathy, LBP  are also affecting patient's functional outcome.   REHAB POTENTIAL: Fair to Good  CLINICAL DECISION MAKING: Stable/uncomplicated  EVALUATION COMPLEXITY: Low  GOALS:  SHORT TERM GOALS: Target date: 12/04/2022 Pt will complete FOTO next visit. Baseline: N/A.  8/8: initial 46/ goal 53 Goal status: Goal met  2.  Pt will be independent and compliant with HEP in order to improve bilateral hip strength and static balance. Baseline: See above for MMT scores/balance scores Goal status: GOAL MET, see above for updated MMT and balance assessment scores   LONG TERM GOALS: Target date: 12/25/2022  Pt will decrease 5TSTS by at least 3 seconds in order to demonstrate decreased risk for falls. Baseline: 15.8 seconds; (12/04/2022) 13.01 seconds, 12.05 seconds (12/18/2022) Goal status: GOAL MET  2.  Pt will improve BERG to at least 45/56 to demonstrate improved static and dynamic balance and decreased risk for falls.  Baseline: 39/56; (12/04/2022) 47/56, 46/56 (12/18/2022) Goal status: GOAL MET  3.  Pt will improve LE MMT scores to at least 4+/5  bilaterally to demonstrate improved LE strength. Baseline: See above for updated scores (12/18/2022) Goal status: Partially met  4.  Pt will increase FOTO score to goal to demonstrate improved balance and performance of ADL's/IADL's. Baseline:  TBD.  See above Goal status: Goal met   PLAN:  PT FREQUENCY: 2x/week  PT DURATION: 6 weeks  PLANNED INTERVENTIONS: Therapeutic exercises, Therapeutic activity, Neuromuscular re-education, Balance training, Gait training, Patient/Family education, Self Care, Cryotherapy, and Moist heat  PLAN FOR NEXT SESSION: D/C from PT, give pt new HEP for variety at home  Cammie Mcgee, PT, DPT # [phone] Cena Benton, SPT 12/25/22, 10:45 AM

## 2022-12-27 ENCOUNTER — Ambulatory Visit: Payer: PPO | Admitting: Physical Therapy

## 2022-12-27 ENCOUNTER — Encounter: Payer: Self-pay | Admitting: Physical Therapy

## 2022-12-27 DIAGNOSIS — R269 Unspecified abnormalities of gait and mobility: Secondary | ICD-10-CM

## 2022-12-27 DIAGNOSIS — R2689 Other abnormalities of gait and mobility: Secondary | ICD-10-CM | POA: Diagnosis not present

## 2022-12-27 DIAGNOSIS — M6281 Muscle weakness (generalized): Secondary | ICD-10-CM

## 2022-12-27 NOTE — Therapy (Signed)
OUTPATIENT PHYSICAL THERAPY LOWER EXTREMITY TREATMENT  Physical Therapy Recertification/Discharge  Patient Name: Taylor Ross MRN: [id number] DOB:Feb 06, 1946, 77 y.o., male Today's Date: 12/27/2022  END OF SESSION:  PT End of Session - 12/27/22 0856     Visit Number 13    Number of Visits 13    Date for PT Re-Evaluation 12/27/22    PT Start Time 0855    PT Stop Time 0935    PT Time Calculation (min) 40 min    Equipment Utilized During Treatment Gait belt    Activity Tolerance Patient tolerated treatment well    Behavior During Therapy WFL for tasks assessed/performed             Past Medical History:  Diagnosis Date   Allergic rhinitis    Bacterial meningitis    Diabetes mellitus without complication (HCC)    Diabetic foot ulcer associated with type 2 diabetes mellitus (HCC)    GERD (gastroesophageal reflux disease)    Gout    Hyperlipidemia    Hypertension    Neuropathic diabetic ulcer of foot (HCC)    Schwannoma    Past Surgical History:  Procedure Laterality Date   BONE EXOSTOSIS EXCISION Right 08/12/2020   Procedure: EXOSTOSIS EXCISION- SADDLEBONE X2- RIGHT;  Surgeon: Gwyneth Revels, DPM;  Location: [facility] ORS;  Service: Podiatry;  Laterality: Right;   BRAIN SURGERY  2006 x 3; 2010 x 1   Benign Tumor Removal [facility])   COLON SURGERY  2008   Dr. Katrinka Blazing [facility])   COLONOSCOPY WITH PROPOFOL N/A 11/08/2015   Procedure: COLONOSCOPY WITH PROPOFOL;  Surgeon: Christena Deem, MD;  Location: [facility] ENDOSCOPY;  Service: Endoscopy;  Laterality: N/A;   COLONOSCOPY WITH PROPOFOL N/A 11/09/2015   Procedure: COLONOSCOPY WITH PROPOFOL;  Surgeon: Christena Deem, MD;  Location: [facility] ENDOSCOPY;  Service: Endoscopy;  Laterality: N/A;   COLONOSCOPY WITH PROPOFOL N/A 10/21/2018   Procedure: COLONOSCOPY WITH PROPOFOL;  Surgeon: Christena Deem, MD;  Location: [facility] ENDOSCOPY;  Service: Endoscopy;  Laterality: N/A;   FLEXIBLE BRONCHOSCOPY N/A 10/04/2015   Procedure: FLEXIBLE  BRONCHOSCOPY;  Surgeon: Merwyn Katos, MD;  Location: [facility] ORS;  Service: Pulmonary;  Laterality: N/A;   Patient Active Problem List   Diagnosis Date Noted   Controlled type 2 diabetes mellitus without complication (HCC) 10/07/2015   Neurilemmoma 10/07/2015   History of colon polyps 10/07/2015   Alimentary obesity 10/07/2015   Combined fat and carbohydrate induced hyperlipemia 10/07/2015   H/O: gout 10/07/2015   ED (erectile dysfunction) of organic origin 10/07/2015   Allergic rhinitis 10/07/2015   Chronic kidney disease (CKD), stage III (moderate) (HCC) 03/23/2015   H/O meningitis 09/08/2006    PCP: Marisue Ivan, MD  REFERRING PROVIDER: Lonell Face, MD  REFERRING DIAG: Balance problem  THERAPY DIAG:  Balance problems  Gait difficulty  Muscle weakness (generalized)  Balance problem  Rationale for Evaluation and Treatment: Rehabilitation  ONSET DATE: 08/12/2020  SUBJECTIVE:   SUBJECTIVE STATEMENT: Pt reports initial onset of balance issues in 2006 when he had 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 He reports his balance has continued to get worse over time, particularly with immediate standing and gait initiation. Pt denies any dizziness/lightheadedness, syncope; denies falls in the last 6 months although has had numerous "near falls." Pt also reports low back pain (R>L) that typically worsens towards the end of the day.  PERTINENT HISTORY: R vestibular schwannoma, peripheral neuropathy, alcohol use, pre-diabetes PAIN:  Are you having pain? Yes: NPRS scale: 3/10 Pain location: R low back Pain description: ache Aggravating factors: activity Relieving factors: Rest  PRECAUTIONS: Fall  RED FLAGS: None   WEIGHT BEARING RESTRICTIONS: No  FALLS:  Has patient fallen in last 6 months? No  LIVING ENVIRONMENT: Lives with: lives with their spouse Lives in: House/apartment Stairs: Yes:  External: 4 steps; bilateral but cannot reach both Has following equipment at home: Single point cane  OCCUPATION: Retired  PLOF: Independent  PATIENT GOALS: Get back to walking  NEXT MD VISIT: TBD  OBJECTIVE:   DIAGNOSTIC FINDINGS: N/A  PATIENT SURVEYS:  FOTO TBD next visit  COGNITION: Overall cognitive status: Within functional limits for tasks assessed     SENSATION: Light touch: Impaired  (Numbness bilaterally at level of malleoli and distally)   POSTURE: rounded shoulders   LOWER EXTREMITY ROM:  Active ROM Right eval Left eval  Hip flexion [facility]  Hip extension    Hip abduction [facility]  Hip adduction    Hip internal rotation [facility] WFL  Hip external rotation [facility]  Knee flexion    Knee extension [facility] WFL  Ankle dorsiflexion [facility] WFL  Ankle plantarflexion    Ankle inversion    Ankle eversion     (Blank rows = not tested)  LOWER EXTREMITY MMT:  MMT Right eval Left eval  Hip flexion 4 4  Hip extension    Hip abduction 4 4  Hip adduction    Hip internal rotation 5 5  Hip external rotation 4+ 4+  Knee flexion 4 4-  Knee extension 5 5  Ankle dorsiflexion 4 4  Ankle plantarflexion    Ankle inversion    Ankle eversion     (Blank rows = not tested)  FUNCTIONAL TESTS:  5 times sit to stand: 15.8 seconds Berg Balance Scale: 39/56 (increased fall risk)  GAIT: Distance walked: 200 feet in clinic/outside Assistive device utilized: None Level of assistance: Complete Independence Comments: Reciprocal gait pattern, slow gait speed, decreased step length bilaterally, narrow BoS  FOTO: initial 46/ goal 53   TODAY'S TREATMENT:                                                                                                                              DATE: 12/27/2022  Subjective: Pt. reports doing well this morning; he has his usual low back pain but his knees feel better than last session.   There.ex.:     Wound Measurements on L knee: 6x5 cm  redness - improved from last session  Nustep x 10 mins to improve cardiovascular endurance and LE strength: Level 4, seat 11 with B UE/LE (no increase c/o shoulder pain).     Resisted sidesteps with green TB around knees: 4 laps in hallway  Neuro.mm.:  Alternating Cone Taps on 6 inch step: 2x1 minute bouts each foot  Walking with head turns in hallway: 4 laps  Dynamic Walking Balance Tasks  in hallway: eyes closed, backwards walking - 2 laps each  Romberg/Semi-tandem on foam pad with head turns: 1x1 minute holds each  Walking Marches in // bars: 4 laps, pt cued to focus more on LE eccentric control than UE movements  All above exercises added to pt's HEP   PATIENT EDUCATION:  Education details: Use of SPC with transfers, ambulation; initial HEP Person educated: Patient Education method: Medical illustrator Education comprehension: verbalized understanding and returned demonstration  HOME EXERCISE PROGRAM: Access Code: [email or web address] URL: [email or web address] Date: 11/13/2022 Prepared by: Dorene Grebe  Exercises - Sit to Stand Without Arm Support  - 2 x daily - 7 x weekly - 1 sets - 10 reps - Standing Tandem Balance with Counter Support  - 2 x daily - 7 x weekly - 1 sets - 10 reps - Narrow Stance with Unilateral Counter Support  - 2 x daily - 7 x weekly - 1 sets - 10 reps   Access Code: [email or web address] URL: [email or web address] Date: 12/27/2022 Prepared by: Dorene Grebe Exercises - Sit to Stand Without Arm Support - 2 x daily - 7 x weekly - 2 sets - 10 reps - Supine Bridge - 2 x daily - 7 x weekly - 2 sets - 10 reps - Tandem Stance with Head Rotation on Foam Pad - 2 x daily - 7 x weekly - 2 sets - 10 reps - Romberg Stance on Foam Pad with Head Rotation - 2 x daily - 7 x weekly - 2 sets - 10 reps - Side Stepping with Resistance at Thighs - 2 x daily - 5-6 x weekly - 2 sets - 10 reps - Walking March - 2 x daily - 7 x weekly - 2 sets - 5 reps - Walking  with Head Rotation - 2 x daily - 7 x weekly - 2 sets - 5 reps - Walking with Eyes Closed and Counter Support - 2 x daily - 7 x weekly - 2 sets - 5 reps - Backwards Walking - 2 x daily - 7 x weekly - 2 sets - 5 reps  ASSESSMENT:  CLINICAL IMPRESSION: Pt presents to PT today for discharge. Session today consisted of reviewing various dynamic and static balance exercises for adding for updated HEP. Pt's old HEP was progressed to performing them on a foam pad/compliant surface; dynamic balance activities added to HEP as well. Pt's wound on his L knee demonstrated improved margins compared to last session. Pt agreeable to discharging from PT at this time with plan to manage symptoms independently at home. Pt educated to perform all balance exercises near a counter or wall for support and safety.  OBJECTIVE IMPAIRMENTS: decreased activity tolerance, decreased balance, decreased knowledge of use of DME, difficulty walking, decreased strength, decreased safety awareness, and impaired sensation.   ACTIVITY LIMITATIONS: standing and squatting  PARTICIPATION LIMITATIONS: community activity  PERSONAL FACTORS: Age, Time since onset of injury/illness/exacerbation, and 3+ comorbidities: Vestibular Schwannoma, diabetic charcot foot, peripheral neuropathy, LBP  are also affecting patient's functional outcome.   REHAB POTENTIAL: Fair to Good  CLINICAL DECISION MAKING: Stable/uncomplicated  EVALUATION COMPLEXITY: Low  GOALS:  LONG TERM GOALS: Target date: 12/27/2022  Pt will decrease 5TSTS by at least 3 seconds in order to demonstrate decreased risk for falls. Baseline: 15.8 seconds; (12/04/2022) 13.01 seconds, 12.05 seconds (12/18/2022) Goal status: GOAL MET  2.  Pt will improve BERG to at least 45/56 to demonstrate improved static and dynamic balance and decreased risk for falls. Baseline: 39/56; (12/04/2022) 47/56, 46/56 (  12/18/2022) Goal status: GOAL MET  3.  Pt will improve LE MMT scores to at least  4+/5 bilaterally to demonstrate improved LE strength. Baseline: See above for updated scores (12/18/2022) Goal status: Partially met  4.  Pt will increase FOTO score to goal to demonstrate improved balance and performance of ADL's/IADL's. Baseline: TBD.  See above Goal status: Goal met   PLAN:  PT DURATION: 1 visit  PLANNED INTERVENTIONS: Therapeutic exercises, Therapeutic activity, Neuromuscular re-education, Balance training, Gait training, Patient/Family education, Self Care, Cryotherapy, and Moist heat  PLAN FOR NEXT SESSION: D/C from PT/ Goal met   Cammie Mcgee, PT, DPT # [id number] Cena Benton, SPT 12/27/22, 11:25 AM

## 2023-01-29 DIAGNOSIS — H35371 Puckering of macula, right eye: Secondary | ICD-10-CM | POA: Diagnosis not present

## 2023-01-29 DIAGNOSIS — H5203 Hypermetropia, bilateral: Secondary | ICD-10-CM | POA: Diagnosis not present

## 2023-01-29 DIAGNOSIS — H02201 Unspecified lagophthalmos right upper eyelid: Secondary | ICD-10-CM | POA: Diagnosis not present

## 2023-01-29 DIAGNOSIS — H1045 Other chronic allergic conjunctivitis: Secondary | ICD-10-CM | POA: Diagnosis not present

## 2023-01-29 DIAGNOSIS — H40013 Open angle with borderline findings, low risk, bilateral: Secondary | ICD-10-CM | POA: Diagnosis not present

## 2023-01-29 DIAGNOSIS — E119 Type 2 diabetes mellitus without complications: Secondary | ICD-10-CM | POA: Diagnosis not present

## 2023-02-28 DIAGNOSIS — M25551 Pain in right hip: Secondary | ICD-10-CM | POA: Diagnosis not present

## 2023-02-28 DIAGNOSIS — R2689 Other abnormalities of gait and mobility: Secondary | ICD-10-CM | POA: Diagnosis not present

## 2023-02-28 DIAGNOSIS — G629 Polyneuropathy, unspecified: Secondary | ICD-10-CM | POA: Diagnosis not present

## 2023-02-28 DIAGNOSIS — Z87898 Personal history of other specified conditions: Secondary | ICD-10-CM | POA: Diagnosis not present

## 2023-02-28 DIAGNOSIS — G8929 Other chronic pain: Secondary | ICD-10-CM | POA: Diagnosis not present

## 2023-03-12 DIAGNOSIS — E11610 Type 2 diabetes mellitus with diabetic neuropathic arthropathy: Secondary | ICD-10-CM | POA: Diagnosis not present

## 2023-03-12 DIAGNOSIS — E1142 Type 2 diabetes mellitus with diabetic polyneuropathy: Secondary | ICD-10-CM | POA: Diagnosis not present

## 2023-03-12 DIAGNOSIS — E782 Mixed hyperlipidemia: Secondary | ICD-10-CM | POA: Diagnosis not present

## 2023-03-12 DIAGNOSIS — I10 Essential (primary) hypertension: Secondary | ICD-10-CM | POA: Diagnosis not present

## 2023-03-12 DIAGNOSIS — M79674 Pain in right toe(s): Secondary | ICD-10-CM | POA: Diagnosis not present

## 2023-03-12 DIAGNOSIS — E1140 Type 2 diabetes mellitus with diabetic neuropathy, unspecified: Secondary | ICD-10-CM | POA: Diagnosis not present

## 2023-03-12 DIAGNOSIS — M79675 Pain in left toe(s): Secondary | ICD-10-CM | POA: Diagnosis not present

## 2023-03-12 DIAGNOSIS — B351 Tinea unguium: Secondary | ICD-10-CM | POA: Diagnosis not present

## 2023-03-19 DIAGNOSIS — E782 Mixed hyperlipidemia: Secondary | ICD-10-CM | POA: Diagnosis not present

## 2023-03-19 DIAGNOSIS — Z0000 Encounter for general adult medical examination without abnormal findings: Secondary | ICD-10-CM | POA: Diagnosis not present

## 2023-03-19 DIAGNOSIS — E1140 Type 2 diabetes mellitus with diabetic neuropathy, unspecified: Secondary | ICD-10-CM | POA: Diagnosis not present

## 2023-03-19 DIAGNOSIS — I10 Essential (primary) hypertension: Secondary | ICD-10-CM | POA: Diagnosis not present

## 2023-04-25 DIAGNOSIS — D485 Neoplasm of uncertain behavior of skin: Secondary | ICD-10-CM | POA: Diagnosis not present

## 2023-04-25 DIAGNOSIS — D2262 Melanocytic nevi of left upper limb, including shoulder: Secondary | ICD-10-CM | POA: Diagnosis not present

## 2023-04-25 DIAGNOSIS — L578 Other skin changes due to chronic exposure to nonionizing radiation: Secondary | ICD-10-CM | POA: Diagnosis not present

## 2023-04-25 DIAGNOSIS — D044 Carcinoma in situ of skin of scalp and neck: Secondary | ICD-10-CM | POA: Diagnosis not present

## 2023-04-25 DIAGNOSIS — L821 Other seborrheic keratosis: Secondary | ICD-10-CM | POA: Diagnosis not present

## 2023-04-25 DIAGNOSIS — Z85828 Personal history of other malignant neoplasm of skin: Secondary | ICD-10-CM | POA: Diagnosis not present

## 2023-04-25 DIAGNOSIS — D2261 Melanocytic nevi of right upper limb, including shoulder: Secondary | ICD-10-CM | POA: Diagnosis not present

## 2023-04-25 DIAGNOSIS — L570 Actinic keratosis: Secondary | ICD-10-CM | POA: Diagnosis not present

## 2023-04-25 DIAGNOSIS — X32XXXA Exposure to sunlight, initial encounter: Secondary | ICD-10-CM | POA: Diagnosis not present

## 2023-04-25 DIAGNOSIS — D2271 Melanocytic nevi of right lower limb, including hip: Secondary | ICD-10-CM | POA: Diagnosis not present

## 2023-04-25 DIAGNOSIS — D2272 Melanocytic nevi of left lower limb, including hip: Secondary | ICD-10-CM | POA: Diagnosis not present

## 2023-04-25 DIAGNOSIS — D225 Melanocytic nevi of trunk: Secondary | ICD-10-CM | POA: Diagnosis not present

## 2023-05-14 DIAGNOSIS — E1142 Type 2 diabetes mellitus with diabetic polyneuropathy: Secondary | ICD-10-CM | POA: Diagnosis not present

## 2023-05-14 DIAGNOSIS — L97411 Non-pressure chronic ulcer of right heel and midfoot limited to breakdown of skin: Secondary | ICD-10-CM | POA: Diagnosis not present

## 2023-05-14 DIAGNOSIS — E11610 Type 2 diabetes mellitus with diabetic neuropathic arthropathy: Secondary | ICD-10-CM | POA: Diagnosis not present

## 2023-05-14 DIAGNOSIS — B351 Tinea unguium: Secondary | ICD-10-CM | POA: Diagnosis not present

## 2023-05-16 DIAGNOSIS — J40 Bronchitis, not specified as acute or chronic: Secondary | ICD-10-CM | POA: Diagnosis not present

## 2023-06-04 DIAGNOSIS — E1142 Type 2 diabetes mellitus with diabetic polyneuropathy: Secondary | ICD-10-CM | POA: Diagnosis not present

## 2023-06-04 DIAGNOSIS — D044 Carcinoma in situ of skin of scalp and neck: Secondary | ICD-10-CM | POA: Diagnosis not present

## 2023-06-04 DIAGNOSIS — E11610 Type 2 diabetes mellitus with diabetic neuropathic arthropathy: Secondary | ICD-10-CM | POA: Diagnosis not present

## 2023-07-02 DIAGNOSIS — E11610 Type 2 diabetes mellitus with diabetic neuropathic arthropathy: Secondary | ICD-10-CM | POA: Diagnosis not present

## 2023-07-02 DIAGNOSIS — E1142 Type 2 diabetes mellitus with diabetic polyneuropathy: Secondary | ICD-10-CM | POA: Diagnosis not present

## 2023-07-23 DIAGNOSIS — E1142 Type 2 diabetes mellitus with diabetic polyneuropathy: Secondary | ICD-10-CM | POA: Diagnosis not present

## 2023-07-23 DIAGNOSIS — E11610 Type 2 diabetes mellitus with diabetic neuropathic arthropathy: Secondary | ICD-10-CM | POA: Diagnosis not present

## 2023-09-12 DIAGNOSIS — E1140 Type 2 diabetes mellitus with diabetic neuropathy, unspecified: Secondary | ICD-10-CM | POA: Diagnosis not present

## 2023-09-12 DIAGNOSIS — E782 Mixed hyperlipidemia: Secondary | ICD-10-CM | POA: Diagnosis not present

## 2023-09-19 DIAGNOSIS — E66812 Obesity, class 2: Secondary | ICD-10-CM | POA: Diagnosis not present

## 2023-09-19 DIAGNOSIS — E782 Mixed hyperlipidemia: Secondary | ICD-10-CM | POA: Diagnosis not present

## 2023-09-19 DIAGNOSIS — Z8739 Personal history of other diseases of the musculoskeletal system and connective tissue: Secondary | ICD-10-CM | POA: Diagnosis not present

## 2023-09-19 DIAGNOSIS — E1140 Type 2 diabetes mellitus with diabetic neuropathy, unspecified: Secondary | ICD-10-CM | POA: Diagnosis not present

## 2023-09-19 DIAGNOSIS — I10 Essential (primary) hypertension: Secondary | ICD-10-CM | POA: Diagnosis not present

## 2023-09-19 DIAGNOSIS — Z6837 Body mass index (BMI) 37.0-37.9, adult: Secondary | ICD-10-CM | POA: Diagnosis not present

## 2023-09-19 DIAGNOSIS — R001 Bradycardia, unspecified: Secondary | ICD-10-CM | POA: Diagnosis not present

## 2023-10-01 DIAGNOSIS — E11610 Type 2 diabetes mellitus with diabetic neuropathic arthropathy: Secondary | ICD-10-CM | POA: Diagnosis not present

## 2023-10-01 DIAGNOSIS — B351 Tinea unguium: Secondary | ICD-10-CM | POA: Diagnosis not present

## 2023-10-01 DIAGNOSIS — M79675 Pain in left toe(s): Secondary | ICD-10-CM | POA: Diagnosis not present

## 2023-10-01 DIAGNOSIS — L97511 Non-pressure chronic ulcer of other part of right foot limited to breakdown of skin: Secondary | ICD-10-CM | POA: Diagnosis not present

## 2023-10-01 DIAGNOSIS — E1142 Type 2 diabetes mellitus with diabetic polyneuropathy: Secondary | ICD-10-CM | POA: Diagnosis not present

## 2023-10-01 DIAGNOSIS — M79674 Pain in right toe(s): Secondary | ICD-10-CM | POA: Diagnosis not present

## 2023-10-23 DIAGNOSIS — L570 Actinic keratosis: Secondary | ICD-10-CM | POA: Diagnosis not present

## 2023-10-31 DIAGNOSIS — L97511 Non-pressure chronic ulcer of other part of right foot limited to breakdown of skin: Secondary | ICD-10-CM | POA: Diagnosis not present

## 2023-10-31 DIAGNOSIS — E1142 Type 2 diabetes mellitus with diabetic polyneuropathy: Secondary | ICD-10-CM | POA: Diagnosis not present

## 2023-10-31 DIAGNOSIS — E11610 Type 2 diabetes mellitus with diabetic neuropathic arthropathy: Secondary | ICD-10-CM | POA: Diagnosis not present

## 2023-11-21 DIAGNOSIS — E11610 Type 2 diabetes mellitus with diabetic neuropathic arthropathy: Secondary | ICD-10-CM | POA: Diagnosis not present

## 2023-11-21 DIAGNOSIS — E1142 Type 2 diabetes mellitus with diabetic polyneuropathy: Secondary | ICD-10-CM | POA: Diagnosis not present

## 2023-11-21 DIAGNOSIS — L97511 Non-pressure chronic ulcer of other part of right foot limited to breakdown of skin: Secondary | ICD-10-CM | POA: Diagnosis not present

## 2023-12-12 DIAGNOSIS — E1142 Type 2 diabetes mellitus with diabetic polyneuropathy: Secondary | ICD-10-CM | POA: Diagnosis not present

## 2023-12-12 DIAGNOSIS — E11610 Type 2 diabetes mellitus with diabetic neuropathic arthropathy: Secondary | ICD-10-CM | POA: Diagnosis not present

## 2023-12-12 DIAGNOSIS — L97511 Non-pressure chronic ulcer of other part of right foot limited to breakdown of skin: Secondary | ICD-10-CM | POA: Diagnosis not present

## 2023-12-24 DIAGNOSIS — M170 Bilateral primary osteoarthritis of knee: Secondary | ICD-10-CM | POA: Diagnosis not present

## 2024-01-14 DIAGNOSIS — E11610 Type 2 diabetes mellitus with diabetic neuropathic arthropathy: Secondary | ICD-10-CM | POA: Diagnosis not present

## 2024-01-14 DIAGNOSIS — E1142 Type 2 diabetes mellitus with diabetic polyneuropathy: Secondary | ICD-10-CM | POA: Diagnosis not present

## 2024-01-14 DIAGNOSIS — L97511 Non-pressure chronic ulcer of other part of right foot limited to breakdown of skin: Secondary | ICD-10-CM | POA: Diagnosis not present

## 2024-01-27 DIAGNOSIS — G8929 Other chronic pain: Secondary | ICD-10-CM | POA: Diagnosis not present

## 2024-01-27 DIAGNOSIS — M25562 Pain in left knee: Secondary | ICD-10-CM | POA: Diagnosis not present

## 2024-01-27 DIAGNOSIS — M170 Bilateral primary osteoarthritis of knee: Secondary | ICD-10-CM | POA: Diagnosis not present

## 2024-01-27 DIAGNOSIS — M25561 Pain in right knee: Secondary | ICD-10-CM | POA: Diagnosis not present

## 2024-01-29 DIAGNOSIS — H40013 Open angle with borderline findings, low risk, bilateral: Secondary | ICD-10-CM | POA: Diagnosis not present

## 2024-01-29 DIAGNOSIS — H5203 Hypermetropia, bilateral: Secondary | ICD-10-CM | POA: Diagnosis not present

## 2024-01-29 DIAGNOSIS — E119 Type 2 diabetes mellitus without complications: Secondary | ICD-10-CM | POA: Diagnosis not present

## 2024-01-29 DIAGNOSIS — H35371 Puckering of macula, right eye: Secondary | ICD-10-CM | POA: Diagnosis not present

## 2024-01-29 DIAGNOSIS — H02201 Unspecified lagophthalmos right upper eyelid: Secondary | ICD-10-CM | POA: Diagnosis not present

## 2024-01-29 DIAGNOSIS — H1045 Other chronic allergic conjunctivitis: Secondary | ICD-10-CM | POA: Diagnosis not present

## 2024-02-03 DIAGNOSIS — M170 Bilateral primary osteoarthritis of knee: Secondary | ICD-10-CM | POA: Diagnosis not present

## 2024-02-11 DIAGNOSIS — M170 Bilateral primary osteoarthritis of knee: Secondary | ICD-10-CM | POA: Diagnosis not present

## 2024-02-18 DIAGNOSIS — E1142 Type 2 diabetes mellitus with diabetic polyneuropathy: Secondary | ICD-10-CM | POA: Diagnosis not present

## 2024-02-18 DIAGNOSIS — L97511 Non-pressure chronic ulcer of other part of right foot limited to breakdown of skin: Secondary | ICD-10-CM | POA: Diagnosis not present

## 2024-02-18 DIAGNOSIS — E11610 Type 2 diabetes mellitus with diabetic neuropathic arthropathy: Secondary | ICD-10-CM | POA: Diagnosis not present

## 2024-03-10 DIAGNOSIS — E1142 Type 2 diabetes mellitus with diabetic polyneuropathy: Secondary | ICD-10-CM | POA: Diagnosis not present

## 2024-03-10 DIAGNOSIS — L97511 Non-pressure chronic ulcer of other part of right foot limited to breakdown of skin: Secondary | ICD-10-CM | POA: Diagnosis not present

## 2024-03-10 DIAGNOSIS — E11610 Type 2 diabetes mellitus with diabetic neuropathic arthropathy: Secondary | ICD-10-CM | POA: Diagnosis not present
